# Patient Record
Sex: Female | Born: 2000 | Race: White | Hispanic: No | Marital: Single | Smoking: Current every day smoker
Health system: Southern US, Community
[De-identification: ages and names within clinical notes are randomized; demographics above are authoritative.]

## PROBLEM LIST (undated history)

## (undated) DIAGNOSIS — F319 Bipolar disorder, unspecified: Secondary | ICD-10-CM

## (undated) DIAGNOSIS — B009 Herpesviral infection, unspecified: Secondary | ICD-10-CM

## (undated) HISTORY — DX: Herpesviral infection, unspecified: B00.9

## (undated) HISTORY — PX: WISDOM TOOTH EXTRACTION: SHX21

## (undated) HISTORY — DX: Bipolar disorder, unspecified: F31.9

---
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
## 2019-01-28 ENCOUNTER — Ambulatory Visit: Payer: Medicaid Other | Admitting: Nurse Practitioner

## 2019-01-28 ENCOUNTER — Encounter: Payer: Self-pay | Admitting: Nurse Practitioner

## 2019-01-28 ENCOUNTER — Other Ambulatory Visit: Payer: Self-pay

## 2019-01-28 VITALS — BP 113/75 | HR 83 | Temp 98.2°F | Ht 63.0 in | Wt 130.5 lb

## 2019-01-28 DIAGNOSIS — F319 Bipolar disorder, unspecified: Secondary | ICD-10-CM

## 2019-01-28 DIAGNOSIS — J301 Allergic rhinitis due to pollen: Secondary | ICD-10-CM | POA: Diagnosis not present

## 2019-01-28 NOTE — Assessment & Plan Note (Signed)
Chronic, ongoing without SI/HI at this time.  Continue to collaborate with psychiatry at [facility].  Continue current medication regimen at this time, has psych visit in 2 weeks.

## 2019-01-28 NOTE — Progress Notes (Signed)
BP 113/75   Pulse 83   Temp 98.2 F (36.8 C) (Oral)   Ht 5\' 3"  (1.6 m)   Wt 130 lb 8 oz (59.2 kg)   SpO2 99%   BMI 23.12 kg/m    Subjective:    Patient ID: Erica Long, female    DOB: 07/17/2001, 18 y.o.   MRN: [id number]  HPI: Erica Long is a 18 y.o. female  Chief Complaint  Patient presents with  . Manic Behavior    8m f/u   BIPOLAR DISORDER: Currently followed by [facility] in [place], last saw one month ago.  At this time taking Trazodone and Seroquel.  Reports she accidentally missed an "appointment or two", but has rescheduled.  States the Trazodone is "not as effective anymore".  She has appointment with psychiatry in 2 weeks.   Mood status: stable Satisfied with current treatment?: with Seroquel Symptom severity: mild  Duration of current treatment : months Side effects: no Medication compliance: good compliance Psychotherapy/counseling: currently obtaining Previous psychiatric medications: Depakote Depressed mood: no Anxious mood: yes Anhedonia: no Significant weight loss or gain: no Insomnia: yes hard to fall asleep Fatigue: yes Feelings of worthlessness or guilt: no Impaired concentration/indecisiveness: no Suicidal ideations: no Hopelessness: no Crying spells: yes Depression screen [facility] 2/9 12/31/2018 11/28/2018 11/20/2018 09/24/2018 09/07/2018  Decreased Interest 1 1 3 3 3   Down, Depressed, Hopeless 1 1 3 3 3   PHQ - 2 Score 2 2 6 6 6   Altered sleeping 0 0 2 2 3   Tired, decreased energy 0 0 3 3 3   Change in appetite 0 2 0 3 1  Feeling bad or failure about yourself  0 0 3 3 3   Trouble concentrating 1 1 3 1 1   Moving slowly or fidgety/restless 0 0 3 1 1   Suicidal thoughts 0 0 1 2 -  PHQ-9 Score 3 5 21 21 18   Difficult doing work/chores Somewhat difficult Somewhat difficult Very difficult Somewhat difficult Somewhat difficult    ALLERGIES Reports history of frequent strep throat, she thinks about 4 times treated last year and  constant irritation "flaring" of tonsils.  Would like referral to ENT.  Currently takes Allegra for allergies. Duration: chronic Runny nose: yes "clear Nasal congestion: no Nasal itching: no Sneezing: yes Eye swelling, itching or discharge: no Post nasal drip: yes Cough: no Sinus pressure: no  Ear pain: none Ear pressure: none Fever: none Symptoms occur seasonally: yes Symptoms occur perenially: no Satisfied with current treatment: no Allergist evaluation in past: no Allergen injection immunotherapy: no Recurrent sinus infections: no ENT evaluation in past: no Known environmental allergy: yes Indoor pets: no History of asthma: no Current allergy medications: Allegra Treatments attempted: does not recall  Relevant past medical, surgical, family and social history reviewed and updated as indicated. Interim medical history since our last visit reviewed. Allergies and medications reviewed and updated.  Review of Systems  Constitutional: Negative for activity change, appetite change, diaphoresis, fatigue and fever.  Respiratory: Negative for cough, chest tightness and shortness of breath.   Cardiovascular: Negative for chest pain, palpitations and leg swelling.  Gastrointestinal: Negative for abdominal distention, abdominal pain, constipation, diarrhea, nausea and vomiting.  Endocrine: Negative for cold intolerance, heat intolerance, polydipsia, polyphagia and polyuria.  Neurological: Negative for dizziness, syncope, weakness, light-headedness, numbness and headaches.  Psychiatric/Behavioral: Negative for decreased concentration, self-injury, sleep disturbance and suicidal ideas.    Per HPI unless specifically indicated above     Objective:    BP 113/75  Pulse 83   Temp 98.2 F (36.8 C) (Oral)   Ht  (1.6 m)   Wt 130 lb 8 oz (59.2 kg)   SpO2 99%   BMI 23.12 kg/m   Wt Readings from Last 3 Encounters:  01/28/19 130 lb 8 oz (59.2 kg) (59 %, Z= 0.23)*  12/31/18 132  lb (59.9 kg) (62 %, Z= 0.31)*  11/28/18 131 lb (59.4 kg) (61 %, Z= 0.27)*   * Growth percentiles are based on CDC (Girls, 2-20 Years) data.    Physical Exam Vitals signs and nursing note reviewed.  Constitutional:      General: She is awake.     Appearance: She is well-developed.  HENT:     Head: Normocephalic.     Right Ear: Hearing, tympanic membrane, ear canal and external ear normal.     Left Ear: Hearing, tympanic membrane, ear canal and external ear normal.     Nose: Rhinorrhea present. No mucosal edema. Rhinorrhea is clear.     Right Sinus: No maxillary sinus tenderness or frontal sinus tenderness.     Left Sinus: No maxillary sinus tenderness or frontal sinus tenderness.     Mouth/Throat:     Mouth: Mucous membranes are moist.     Pharynx: Posterior oropharyngeal erythema (mild with cobblestoning) present. No pharyngeal swelling or oropharyngeal exudate.     Tonsils: Swelling: 1+ on the right. 1+ on the left.  Eyes:     General:        Right eye: No discharge.        Left eye: No discharge.     Conjunctiva/sclera: Conjunctivae normal.     Pupils: Pupils are equal, round, and reactive to light.  Neck:     Musculoskeletal: Normal range of motion and neck supple.     Thyroid: No thyromegaly.     Vascular: No carotid bruit or JVD.  Cardiovascular:     Rate and Rhythm: Normal rate and regular rhythm.     Heart sounds: Normal heart sounds. No murmur. No gallop.   Pulmonary:     Effort: Pulmonary effort is normal.     Breath sounds: Normal breath sounds.  Abdominal:     General: Bowel sounds are normal.     Palpations: Abdomen is soft.  Musculoskeletal:     Right lower leg: No edema.     Left lower leg: No edema.  Lymphadenopathy:     Cervical: No cervical adenopathy.  Skin:    General: Skin is warm and dry.  Neurological:     Mental Status: She is alert and oriented to person, place, and time.  Psychiatric:        Mood and Affect: Mood normal.        Behavior:  Behavior normal. Behavior is cooperative.        Thought Content: Thought content normal.        Judgment: Judgment normal.     Results for orders placed or performed in visit on 12/31/18  Pregnancy, urine  Result Value Ref Range   Preg Test, Ur Negative Negative      Assessment & Plan:   Problem List Items Addressed This Visit      Respiratory   Seasonal allergic rhinitis due to pollen    Reports h/o frequent strep and that she has frequent "throat flares" and irritation.  ENT referral per patient request.  Recommend switching to Zyrtec or Claritin for allergic rhinitis.      Relevant Orders  Ambulatory referral to ENT     Other   Bipolar I disorder (HCC) - Primary    Chronic, ongoing without SI/HI at this time.  Continue to collaborate with psychiatry at [facility]  Continue current medication regimen at this time, has psych visit in 2 weeks.          Follow up plan: Return in about 2 months (around 03/30/2019) for Depo Provera injection.

## 2019-01-28 NOTE — Assessment & Plan Note (Signed)
Reports h/o frequent strep and that she has frequent "throat flares" and irritation.  ENT referral per patient request.  Recommend switching to Zyrtec or Claritin for allergic rhinitis.

## 2019-01-28 NOTE — Patient Instructions (Signed)

## 2019-03-15 ENCOUNTER — Other Ambulatory Visit: Payer: Self-pay | Admitting: Nurse Practitioner

## 2019-03-25 ENCOUNTER — Telehealth: Payer: Medicaid Other

## 2019-03-25 ENCOUNTER — Other Ambulatory Visit: Payer: Self-pay

## 2019-03-25 DIAGNOSIS — Z3009 Encounter for other general counseling and advice on contraception: Secondary | ICD-10-CM

## 2019-03-25 MED ORDER — MEDROXYPROGESTERONE ACETATE 150 MG/ML IM SUSP
150.0000 mg | Freq: Once | INTRAMUSCULAR | Status: AC
  Administered 2019-03-25: 150 mg via INTRAMUSCULAR

## 2019-03-25 NOTE — Telephone Encounter (Signed)
She is seeing psychiatry and will need refill through them, as no notes available from them on current dose.

## 2019-03-25 NOTE — Telephone Encounter (Signed)
Patient stated that this was meant to be a fill for trazadone, not depakote. Explained to patient this may still have to come from psych because it's listed as historical med in chart, not that Erica Long has filled it.  Routing to provider to advise.

## 2019-03-25 NOTE — Progress Notes (Unsigned)
Pt was administered Depo injection IM in left deltoid. Pt tolerated well. Pt's next date range for Depo is July 27th - August 10.

## 2019-03-25 NOTE — Telephone Encounter (Signed)
Yes, since she is being followed by psych I prefer all psychiatric meds be filled by them as I do not have access to their notes at this time or any changes that have been made.  Thank you.

## 2019-03-25 NOTE — Telephone Encounter (Signed)
Patient notifeid and verbalized understanding

## 2019-03-25 NOTE — Telephone Encounter (Signed)
Medication refill for divalproex.  Last visit 01/28/2019

## 2019-06-10 ENCOUNTER — Ambulatory Visit (INDEPENDENT_AMBULATORY_CARE_PROVIDER_SITE_OTHER): Payer: Medicaid Other

## 2019-06-10 ENCOUNTER — Other Ambulatory Visit: Payer: Self-pay

## 2019-06-10 DIAGNOSIS — Z3042 Encounter for surveillance of injectable contraceptive: Secondary | ICD-10-CM

## 2019-06-10 MED ORDER — MEDROXYPROGESTERONE ACETATE 150 MG/ML IM SUSP
150.0000 mg | Freq: Once | INTRAMUSCULAR | Status: AC
  Administered 2019-06-10: 150 mg via INTRAMUSCULAR

## 2019-08-30 ENCOUNTER — Other Ambulatory Visit: Payer: Self-pay

## 2019-08-30 DIAGNOSIS — Z20828 Contact with and (suspected) exposure to other viral communicable diseases: Secondary | ICD-10-CM | POA: Diagnosis not present

## 2019-08-30 DIAGNOSIS — Z20822 Contact with and (suspected) exposure to covid-19: Secondary | ICD-10-CM

## 2019-09-01 LAB — NOVEL CORONAVIRUS, NAA: SARS-CoV-2, NAA: NOT DETECTED

## 2019-09-03 ENCOUNTER — Ambulatory Visit (INDEPENDENT_AMBULATORY_CARE_PROVIDER_SITE_OTHER): Payer: Medicaid Other

## 2019-09-03 ENCOUNTER — Other Ambulatory Visit: Payer: Self-pay

## 2019-09-03 DIAGNOSIS — Z3042 Encounter for surveillance of injectable contraceptive: Secondary | ICD-10-CM

## 2019-09-03 DIAGNOSIS — F3181 Bipolar II disorder: Secondary | ICD-10-CM | POA: Diagnosis not present

## 2019-09-03 MED ORDER — MEDROXYPROGESTERONE ACETATE 150 MG/ML IM SUSP
150.0000 mg | Freq: Once | INTRAMUSCULAR | Status: AC
  Administered 2019-09-03: 150 mg via INTRAMUSCULAR

## 2019-10-01 DIAGNOSIS — F3181 Bipolar II disorder: Secondary | ICD-10-CM | POA: Diagnosis not present

## 2019-10-29 DIAGNOSIS — F3181 Bipolar II disorder: Secondary | ICD-10-CM | POA: Diagnosis not present

## 2019-11-04 ENCOUNTER — Telehealth: Payer: Self-pay | Admitting: Nurse Practitioner

## 2019-11-04 DIAGNOSIS — J039 Acute tonsillitis, unspecified: Secondary | ICD-10-CM

## 2019-11-04 NOTE — Telephone Encounter (Signed)
Patient notified and verbalized understanding. 

## 2019-11-04 NOTE — Telephone Encounter (Signed)
Copied from [place] [id number]. Topic: Referral - Status >> Nov 04, 2019 10:58 AM Scherrie Gerlach wrote: Reason for CRM: pt needs new referral to [facility].  The last one was March 2020, and they were in lock down due to covid. They advised pt they need new referral for tonsil removal in order for them to see her.  Pt needs asap, having active issue and problems, need to be seen urgently.

## 2019-11-06 ENCOUNTER — Ambulatory Visit: Payer: Medicaid Other | Attending: Internal Medicine

## 2019-11-06 DIAGNOSIS — Z20828 Contact with and (suspected) exposure to other viral communicable diseases: Secondary | ICD-10-CM | POA: Diagnosis not present

## 2019-11-06 DIAGNOSIS — Z20822 Contact with and (suspected) exposure to covid-19: Secondary | ICD-10-CM

## 2019-11-08 ENCOUNTER — Emergency Department: Payer: Medicaid Other

## 2019-11-08 ENCOUNTER — Other Ambulatory Visit: Payer: Self-pay

## 2019-11-08 ENCOUNTER — Encounter: Payer: Self-pay | Admitting: Emergency Medicine

## 2019-11-08 ENCOUNTER — Emergency Department
Admission: EM | Admit: 2019-11-08 | Discharge: 2019-11-08 | Disposition: A | Discharge status: Home / Self Care | Payer: Medicaid Other | Attending: Emergency Medicine | Admitting: Emergency Medicine

## 2019-11-08 DIAGNOSIS — Z79899 Other long term (current) drug therapy: Secondary | ICD-10-CM | POA: Insufficient documentation

## 2019-11-08 DIAGNOSIS — L271 Localized skin eruption due to drugs and medicaments taken internally: Secondary | ICD-10-CM | POA: Insufficient documentation

## 2019-11-08 DIAGNOSIS — F17290 Nicotine dependence, other tobacco product, uncomplicated: Secondary | ICD-10-CM | POA: Insufficient documentation

## 2019-11-08 DIAGNOSIS — L270 Generalized skin eruption due to drugs and medicaments taken internally: Secondary | ICD-10-CM

## 2019-11-08 DIAGNOSIS — L509 Urticaria, unspecified: Secondary | ICD-10-CM | POA: Diagnosis not present

## 2019-11-08 LAB — GROUP A STREP BY PCR: Group A Strep by PCR: NOT DETECTED

## 2019-11-08 LAB — NOVEL CORONAVIRUS, NAA: SARS-CoV-2, NAA: NOT DETECTED

## 2019-11-08 MED ORDER — METHYLPREDNISOLONE 4 MG PO TBPK: ORAL_TABLET | ORAL | 0 refills | Status: DC

## 2019-11-08 MED ORDER — HYDROXYZINE HCL 50 MG PO TABS: 50.0000 mg | ORAL_TABLET | Freq: Three times a day (TID) | ORAL | 0 refills | Status: DC | PRN

## 2019-11-08 MED ORDER — PREDNISONE 20 MG PO TABS
60.0000 mg | ORAL_TABLET | Freq: Once | ORAL | Status: AC
  Administered 2019-11-08: 60 mg via ORAL
  Filled 2019-11-08: qty 3

## 2019-11-08 MED ORDER — HYDROXYZINE HCL 50 MG PO TABS
50.0000 mg | ORAL_TABLET | Freq: Once | ORAL | Status: AC
  Administered 2019-11-08: 50 mg via ORAL
  Filled 2019-11-08: qty 1

## 2019-11-08 NOTE — ED Notes (Addendum)
See triage note - pt with rash. Denies swollen tongue/airway.

## 2019-11-08 NOTE — ED Triage Notes (Signed)
States she woke up with hives/rash

## 2019-11-08 NOTE — ED Provider Notes (Signed)
[facility] Emergency Department Provider Note   ____________________________________________   First MD Initiated Contact with Patient 11/08/19 1522     (approximate)  I have reviewed the triage vital signs and the nursing notes.   HISTORY  Chief Complaint Urticaria    HPI Erica Long is a 18 y.o. female patient waking this morning with a rash.  Rash is diffuse.  Patient states mild "burning" sensation" similar to a sunburn to her body.  Patient denies any new foods or drinks.  Patient stated no new personal or laundry hygiene products.  Patient states he started taking Lamictal 2 weeks ago.  Patient denies pain.  Patient also complained of chest congestion and sore throat.  Patient tested negative for COVID-19 on 11/06/2019.     Past Medical History:  Diagnosis Date  . Bipolar 1 disorder [facility])     Patient Active Problem List   Diagnosis Date Noted  . Seasonal allergic rhinitis due to pollen 01/28/2019  . Birth control counseling 12/31/2018  . Bipolar I disorder ([place]) 09/07/2018    Past Surgical History:  Procedure Laterality Date  . WISDOM TOOTH EXTRACTION      Prior to Admission medications   Medication Sig Start Date End Date Taking? Authorizing Provider  divalproex (DEPAKOTE) 250 MG DR tablet TAKE 2 TABLETS (500 MG TOTAL) BY MOUTH AT BEDTIME. 02/17/19   [provider]  hydrOXYzine (ATARAX/VISTARIL) 50 MG tablet Take 1 tablet (50 mg total) by mouth 3 (three) times daily as needed. 11/08/19   Sable Feil, PA-C  medroxyPROGESTERone (DEPO-PROVERA) 150 MG/ML injection Inject into the muscle. 12/26/18   [provider]  methylPREDNISolone (MEDROL DOSEPAK) 4 MG TBPK tablet Take Tapered dose as directed 11/08/19   Sable Feil, PA-C  QUEtiapine (SEROQUEL) 25 MG tablet Take 1 tablet (25 mg total) by mouth at bedtime. 11/20/18   Cannady, Henrine Screws T, NP  traZODone (DESYREL) 50 MG tablet Take by mouth. 12/19/18   [provider]    Allergies Patient has no known allergies.  Family History  Problem Relation Age of Onset  . Bipolar disorder Mother   . Depression Mother   . Heart attack Maternal Grandmother   . COPD Paternal Grandmother   . Depression Paternal Grandmother     Social History Social History   Tobacco Use  . Smoking status: Current Every Day Smoker    Types: E-cigarettes  . Smokeless tobacco: Never Used  Substance Use Topics  . Alcohol use: Yes    Comment: occasional binge drinking  . Drug use: Yes    Types: Marijuana    Review of Systems Constitutional: No fever/chills Eyes: No visual changes. ENT: No sore throat. Cardiovascular: Denies chest pain. Respiratory: Dyspnea. Gastrointestinal: No abdominal pain.  No nausea, no vomiting.  No diarrhea.  No constipation. Genitourinary: Negative for dysuria. Musculoskeletal: Negative for back pain. Skin: Negative for rash. Neurological: Negative for headaches, focal weakness or numbness. Psychiatric:  Bipolar and depression  ____________________________________________   PHYSICAL EXAM:  VITAL SIGNS: ED Triage Vitals  Enc Vitals Group     BP 11/08/19 1315 121/81     Pulse Rate 11/08/19 1315 (!) 108     Resp 11/08/19 1315 18     Temp 11/08/19 1315 98.7 F (37.1 C)     Temp Source 11/08/19 1315 Oral     SpO2 11/08/19 1315 98 %     Weight 11/08/19 1315 112 lb (50.8 kg)     Height 11/08/19 1315  5\' 6"  (1.676 m)     Head Circumference --      Peak Flow --      Pain Score 11/08/19 1313 0     Pain Loc --      Pain Edu? --      Excl. in GC? --    Constitutional: Alert and oriented. Well appearing and in no acute distress. Eyes: Conjunctivae are normal. PERRL. EOMI. Head: Atraumatic. Nose: No congestion/rhinnorhea. Mouth/Throat: Mucous membranes are moist.  Oropharynx non-erythematous. Neck: No stridor.   Hematological/Lymphatic/Immunilogical: No cervical lymphadenopathy. Cardiovascular: Normal rate, regular  rhythm. Grossly normal heart sounds.  Good peripheral circulation. Respiratory: Normal respiratory effort.  No retractions. Lungs CTAB. Gastrointestinal: Soft and nontender. No distention. No abdominal bruits. No CVA tenderness. Neurologic:  Normal speech and language. No gross focal neurologic deficits are appreciated. No gait instability. Skin:  Skin is warm, dry and intact.  Diffuse erythematous macular lesions.   Psychiatric: Mood and affect are normal. Speech and behavior are normal.  ____________________________________________   LABS (all labs ordered are listed, but only abnormal results are displayed)  Labs Reviewed  GROUP A STREP BY PCR   ____________________________________________  EKG   ____________________________________________  RADIOLOGY  ED MD interpretation:    Official radiology report(s): DG Chest 2 View  Result Date: 11/08/2019 CLINICAL DATA:  Hives. EXAM: CHEST - 2 VIEW COMPARISON:  08/06/2016 FINDINGS: The heart size and mediastinal contours are within normal limits. Both lungs are clear. The visualized skeletal structures are unremarkable. IMPRESSION: Normal exam. Electronically Signed   By: 08/08/2016 M.D.   On: 11/08/2019 15:51    ____________________________________________   PROCEDURES  Procedure(s) performed (including Critical Care):  Procedures   ____________________________________________   INITIAL IMPRESSION / ASSESSMENT AND PLAN / ED COURSE  As part of my medical decision making, I reviewed the following data within the electronic MEDICAL RECORD NUMBER     Patient presents with diffuse body rash upon a.m. awakening.  Patient states burning sensation has decreased status post prednisone and Atarax.  Patient given discharge care instruction.  Patient advised to discontinue Lamictal until she contact the prescribing doctor since this medication is not prescribed for seizures..  Return back to ED if condition worsens    [name]  was evaluated in Emergency Department on 11/08/2019 for the symptoms described in the history of present illness. She was evaluated in the context of the global COVID-19 pandemic, which necessitated consideration that the patient might be at risk for infection with the SARS-CoV-2 virus that causes COVID-19. Institutional protocols and algorithms that pertain to the evaluation of patients at risk for COVID-19 are in a state of rapid change based on information released by regulatory bodies including the CDC and federal and state organizations. These policies and algorithms were followed during the patient's care in the ED.       ____________________________________________   FINAL CLINICAL IMPRESSION(S) / ED DIAGNOSES  Final diagnoses:  Drug rash     ED Discharge Orders         Ordered    hydrOXYzine (ATARAX/VISTARIL) 50 MG tablet  3 times daily PRN     11/08/19 1629    methylPREDNISolone (MEDROL DOSEPAK) 4 MG TBPK tablet     11/08/19 1629           Note:  This document was prepared using Dragon voice recognition software and may include unintentional dictation errors.    11/10/19, PA-C 11/08/19 1646    11/10/19, MD  11/19/19 0004  

## 2019-11-08 NOTE — Discharge Instructions (Addendum)
Discontinue Lamictal until follow-up with prescribing doctor.  Pick up medications in the morning and continue taking as directed.  Advised 25 mg of Benadryl until the pharmacy open.  Take 1 every 6-8 hours.

## 2019-11-25 ENCOUNTER — Other Ambulatory Visit: Payer: Self-pay | Admitting: Nurse Practitioner

## 2019-11-25 ENCOUNTER — Ambulatory Visit (INDEPENDENT_AMBULATORY_CARE_PROVIDER_SITE_OTHER): Payer: Medicaid Other

## 2019-11-25 ENCOUNTER — Other Ambulatory Visit: Payer: Self-pay

## 2019-11-25 DIAGNOSIS — Z3042 Encounter for surveillance of injectable contraceptive: Secondary | ICD-10-CM | POA: Diagnosis not present

## 2019-11-25 DIAGNOSIS — Z3009 Encounter for other general counseling and advice on contraception: Secondary | ICD-10-CM

## 2019-11-25 MED ORDER — MEDROXYPROGESTERONE ACETATE 150 MG/ML IM SUSP
150.0000 mg | Freq: Once | INTRAMUSCULAR | Status: AC
  Administered 2019-11-25: 150 mg via INTRAMUSCULAR

## 2019-11-26 DIAGNOSIS — F3181 Bipolar II disorder: Secondary | ICD-10-CM | POA: Diagnosis not present

## 2019-12-10 DIAGNOSIS — J309 Allergic rhinitis, unspecified: Secondary | ICD-10-CM | POA: Diagnosis not present

## 2019-12-10 DIAGNOSIS — K219 Gastro-esophageal reflux disease without esophagitis: Secondary | ICD-10-CM | POA: Diagnosis not present

## 2019-12-10 DIAGNOSIS — J312 Chronic pharyngitis: Secondary | ICD-10-CM | POA: Diagnosis not present

## 2019-12-17 DIAGNOSIS — J301 Allergic rhinitis due to pollen: Secondary | ICD-10-CM | POA: Diagnosis not present

## 2019-12-24 DIAGNOSIS — F3181 Bipolar II disorder: Secondary | ICD-10-CM | POA: Diagnosis not present

## 2020-01-01 DIAGNOSIS — K219 Gastro-esophageal reflux disease without esophagitis: Secondary | ICD-10-CM | POA: Diagnosis not present

## 2020-01-01 DIAGNOSIS — J309 Allergic rhinitis, unspecified: Secondary | ICD-10-CM | POA: Diagnosis not present
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
## 2020-02-10 ENCOUNTER — Other Ambulatory Visit: Payer: Self-pay

## 2020-02-10 ENCOUNTER — Ambulatory Visit (INDEPENDENT_AMBULATORY_CARE_PROVIDER_SITE_OTHER): Payer: Medicaid Other

## 2020-02-10 DIAGNOSIS — Z3042 Encounter for surveillance of injectable contraceptive: Secondary | ICD-10-CM

## 2020-02-10 MED ORDER — MEDROXYPROGESTERONE ACETATE 150 MG/ML IM SUSP
150.0000 mg | Freq: Once | INTRAMUSCULAR | Status: AC
  Administered 2020-02-10: 150 mg via INTRAMUSCULAR

## 2020-02-14 DIAGNOSIS — F3181 Bipolar II disorder: Secondary | ICD-10-CM | POA: Diagnosis not present

## 2020-02-29 ENCOUNTER — Ambulatory Visit: Payer: Medicaid Other | Attending: Internal Medicine

## 2020-02-29 DIAGNOSIS — Z23 Encounter for immunization: Secondary | ICD-10-CM

## 2020-02-29 NOTE — Progress Notes (Signed)
   Covid-19 Vaccination Clinic  Name:  QUINCI GAVIDIA    MRN: [id number] DOB: [birth year]  02/29/2020  Ms. Hord was observed post Covid-19 immunization for 15 minutes without incident. She was provided with Vaccine Information Sheet and instruction to access the V-Safe system.   Ms. Harnack was instructed to call 911 with any severe reactions post vaccine: Marland Kitchen Difficulty breathing  . Swelling of face and throat  . A fast heartbeat  . A bad rash all over body  . Dizziness and weakness   Immunizations Administered    Name Date Dose VIS Date Route   Pfizer COVID-19 Vaccine 02/29/2020  6:45 PM 0.3 mL 10/25/2019 Intramuscular   Manufacturer: [employer], [employer]   Lot: [id number]   NDC: [address]-0

## 2020-03-21 ENCOUNTER — Ambulatory Visit: Payer: Self-pay

## 2020-03-24 ENCOUNTER — Ambulatory Visit: Payer: Medicaid Other | Attending: Internal Medicine

## 2020-03-24 DIAGNOSIS — Z23 Encounter for immunization: Secondary | ICD-10-CM

## 2020-03-24 NOTE — Progress Notes (Signed)
   Covid-19 Vaccination Clinic  Name:  Erica Long    MRN: [id number] DOB: [birth year]  03/24/2020  [name] was observed post Covid-19 immunization for 15 minutes without incident. She was provided with Vaccine Information Sheet and instruction to access the V-Safe system.   [name] was instructed to call 911 with any severe reactions post vaccine: Marland Kitchen Difficulty breathing  . Swelling of face and throat  . A fast heartbeat  . A bad rash all over body  . Dizziness and weakness   Immunizations Administered    Name Date Dose VIS Date Route   Pfizer COVID-19 Vaccine 03/24/2020  4:12 PM 0.3 mL 01/08/2019 Intramuscular   Manufacturer: [employer], [employer]   Lot: [id number]   NDC: [address]-5

## 2020-04-08 ENCOUNTER — Ambulatory Visit: Payer: Medicaid Other | Admitting: Nurse Practitioner

## 2020-04-14 DIAGNOSIS — F3181 Bipolar II disorder: Secondary | ICD-10-CM | POA: Diagnosis not present

## 2020-05-04 DIAGNOSIS — Z20822 Contact with and (suspected) exposure to covid-19: Secondary | ICD-10-CM | POA: Diagnosis not present

## 2020-05-06 DIAGNOSIS — B349 Viral infection, unspecified: Secondary | ICD-10-CM | POA: Diagnosis not present

## 2020-05-06 DIAGNOSIS — R05 Cough: Secondary | ICD-10-CM | POA: Diagnosis not present

## 2020-05-07 ENCOUNTER — Ambulatory Visit: Payer: Medicaid Other

## 2020-06-16 DIAGNOSIS — F3181 Bipolar II disorder: Secondary | ICD-10-CM | POA: Diagnosis not present

## 2020-06-19 ENCOUNTER — Other Ambulatory Visit: Payer: Self-pay

## 2020-06-19 ENCOUNTER — Ambulatory Visit: Payer: Self-pay | Admitting: *Deleted

## 2020-06-19 ENCOUNTER — Encounter: Payer: Self-pay | Admitting: Emergency Medicine

## 2020-06-19 ENCOUNTER — Emergency Department
Admission: EM | Admit: 2020-06-19 | Discharge: 2020-06-19 | Disposition: A | Discharge status: Home / Self Care | Payer: Medicaid Other | Attending: Student in an Organized Health Care Education/Training Program | Admitting: Student in an Organized Health Care Education/Training Program

## 2020-06-19 ENCOUNTER — Emergency Department: Payer: Medicaid Other

## 2020-06-19 DIAGNOSIS — R55 Syncope and collapse: Secondary | ICD-10-CM | POA: Diagnosis not present

## 2020-06-19 DIAGNOSIS — F17290 Nicotine dependence, other tobacco product, uncomplicated: Secondary | ICD-10-CM | POA: Insufficient documentation

## 2020-06-19 DIAGNOSIS — R531 Weakness: Secondary | ICD-10-CM | POA: Diagnosis not present

## 2020-06-19 LAB — URINALYSIS, COMPLETE (UACMP) WITH MICROSCOPIC
Bilirubin Urine: NEGATIVE
Glucose, UA: NEGATIVE mg/dL
Hgb urine dipstick: NEGATIVE
Ketones, ur: NEGATIVE mg/dL
Leukocytes,Ua: NEGATIVE
Nitrite: NEGATIVE
Protein, ur: NEGATIVE mg/dL
Specific Gravity, Urine: 1.015 (ref 1.005–1.030)
pH: 6 (ref 5.0–8.0)

## 2020-06-19 LAB — BASIC METABOLIC PANEL
Anion gap: 9 (ref 5–15)
BUN: 11 mg/dL (ref 6–20)
CO2: 25 mmol/L (ref 22–32)
Calcium: 9.1 mg/dL (ref 8.9–10.3)
Chloride: 104 mmol/L (ref 98–111)
Creatinine, Ser: 0.66 mg/dL (ref 0.44–1.00)
GFR calc Af Amer: >60 mL/min (ref >60)
GFR calc non Af Amer: >60 mL/min (ref >60)
Glucose, Bld: 76 mg/dL (ref 70–99)
Potassium: 3.6 mmol/L (ref 3.5–5.1)
Sodium: 138 mmol/L (ref 135–145)

## 2020-06-19 LAB — CBC
HCT: 39 % (ref 36.0–46.0)
Hemoglobin: 14 g/dL (ref 12.0–15.0)
MCH: 30 pg (ref 26.0–34.0)
MCHC: 35.9 g/dL (ref 30.0–36.0)
MCV: 83.7 fL (ref 80.0–100.0)
Platelets: 225 10*3/uL (ref 150–400)
RBC: 4.66 MIL/uL (ref 3.87–5.11)
RDW: 12 % (ref 11.5–15.5)
WBC: 5.1 10*3/uL (ref 4.0–10.5)
nRBC: 0 % (ref 0.0–0.2)

## 2020-06-19 LAB — POCT PREGNANCY, URINE: Preg Test, Ur: NEGATIVE

## 2020-06-19 NOTE — Discharge Instructions (Addendum)
Your exam, labs, and CT scan are negative and reassuring at this time.  It is difficult to determine the exact cause of your episode of near fainting yesterday. You should follow-up with your providers for further evaluation. Take your prescribed medicines, and be sure to eat regularly to prevent any possible low blood sugar or dehydration. Return as needed.

## 2020-06-19 NOTE — Telephone Encounter (Signed)
Pt called in c/o feeling dizzy and weak after lunch (ate 2 bites of a sandwich in the car) 2 hours later.  I went into work and was told "you are shaking real bad".   I felt dizzy and weak also.   I went over to the gym and got a protein drink.   They told me at the gym they could see I was shaking.   I went back into work and my boss told me to go on home early. While driving home from work I was "zoning out" and staring at one spot.   My whole body felt very heavy.  I ended up pulling over for about 40 minutes because I couldn't drive.    Finally I was able to get home.   When I got home I went and laid down in the bed.   My left hip was numb and I was still "zoning out" and staring at one spot.  No history of strokes, no new medications started.    Then I took a shower and while in the shower I got real dizzy and weak especially on my left side.   I almost fell.   My left hip is hurting this morning and feels "weird".    Yesterday and this morning her lips feel "weird".   Denies any change in her taste or swelling in her mouth tongue or throat.   C/O her left side feeling numb or different.  I referred her to the ED per the protocol.   She is going to [facility].   I instructed her not to drive did she have someone who could take her.   She  Said she did.   "I don't think I could drive very well now anyway".  I told her to call 911 if she suddenly could not walk, became more dizzy, numb on either side of her body, speech became slurred, facial droop on one side, wasn't able to move one side of her body, visual changes.   She verbalized understanding and was agreeable.   She's going to call someone to take her to the ED today.   She asked if she needed to go today and I told her yes she needs to go as soon as possible/now.  She agreed.   I sent my notes to [facility] office at [facility] for her information.   Reason for Disposition . [1] Numbness (i.e.,  loss of sensation) of the face, arm / hand, or leg / foot on one side of the body AND [2] sudden onset AND [3] brief (now gone)  Answer Assessment - Initial Assessment Questions 1. SYMPTOM: "What is the main symptom you are concerned about?" (e.g., weakness, numbness)     Before work yesterday I sat in car and tried to eat lunch.   For 2 months I've not had much appetite.   I took 2 bites of sandwich.   2 hours later I felt dizzy and weak.   I went to the gym and got a protein drink.   I was still shaking.   My boss told me to go home early.   I was "zoning out".   I was just staring at one thing.    I was driving home and pulled over for 40 min.   I finally drove home and rested in bed. My left hip went numb.  Then I took a shower and got dizzy and got weak  and almost fell due to the left side weak. While driving I felt so heavy all over my body.  I usually drive with one hand on wheel but I drove with 2 hands on the wheel driving home.   I just kept zoning out and staring at one spot.   2. ONSET: "When did this start?" (minutes, hours, days; while sleeping)     Yesterday about 5:00 PM. 3. LAST NORMAL: "When was the last time you were normal (no symptoms)?"     5:00 PM 4. PATTERN "Does this come and go, or has it been constant since it started?"  "Is it present now?"     My left hip hurts.  When I woke up I forced myself to eat breakfast.   In the gym I'm trying to gain weight.   I lost 10 lbs then 10 more lbs.  I lost the first 10 lbs over a month and the 2nd 10 lbs quickly.   I was going to the gym at this point.    I'm trying to gain weight. 5. CARDIAC SYMPTOMS: "Have you had any of the following symptoms: chest pain, difficulty breathing, palpitations?"     No.    6. NEUROLOGIC SYMPTOMS: "Have you had any of the following symptoms: headache, dizziness, vision loss, double vision, changes in speech, unsteady on your feet?"     *No Answer* 7. OTHER SYMPTOMS: "Do you have any other symptoms?"      Lips feel weird when I was drinking the protein drink yesterday.  Today my lips still feel "weird".   My throat is normal.  I see an ENT for throat problems due to allergies.   Nothing new for me. 8. PREGNANCY: "Is there any chance you are pregnant?" "When was your last menstrual period?"     No I take birth control and not sexually active.  Protocols used: NEUROLOGIC DEFICIT-A-AH

## 2020-06-19 NOTE — Telephone Encounter (Signed)
Agree with ER referral and will follow-up with her after ER visit

## 2020-06-19 NOTE — ED Triage Notes (Signed)
Pt here for near syncope while at work yesterday.  Also c/o left knee and hip pain.  ambulatory without difficulty. VSS. No fevers.

## 2020-06-19 NOTE — ED Provider Notes (Signed)
[facility] Emergency Department Provider Note ____________________________________________  Time seen: 1145  I have reviewed the triage vital signs and the nursing notes.  HISTORY  Chief Complaint  Near Syncope  HPI Erica Long is a 19 y.o. female with a history of bipolar disorder, presents herself to the ED for evaluation of a near syncopal episode that occurred while at work yesterday. sSe describes an episode after awakening and reporting to work, when she began to feel weak and dizzy. She notified her supervisor, who advised her to come into the office to sit down. She reports that she did not pass out or lose consciousness. According to report given to the patient, her supervisor witnessed her "zoning out" and staring off into space, for about 30-40 minutes. The patient skipped breakfast that morning as she works a swing schedule. She denies nausea, vomiting, fevers, chills, sweats, chest pain, or paralysis. She notes that her left arm and leg felt heavy as she drove herself home from work. No one called EMS to evaluate the patient at the time of her near-syncopal episode. Once home, she ate some gummy bears after taking a shower. She reported to work today, but was advised by her supervisor to report to the ED for evaluation. She called her PCP who was concerned for a possible stroke, given her left-sided weakness. She presents today without headache, vision changes, paralysis, weakness, or paresthesias.   Past Medical History:  Diagnosis Date  . Bipolar 1 disorder [facility])     Patient Active Problem List   Diagnosis Date Noted  . Seasonal allergic rhinitis due to pollen 01/28/2019  . Birth control counseling 12/31/2018  . Bipolar I disorder (HCC) 09/07/2018    Past Surgical History:  Procedure Laterality Date  . WISDOM TOOTH EXTRACTION      Prior to Admission medications   Medication Sig Start Date End Date Taking? Authorizing Provider  cetirizine  (ZYRTEC) 10 MG tablet Take 10 mg by mouth daily.   Yes [provider]  fluticasone (FLONASE) 50 MCG/ACT nasal spray Place 1 spray into both nostrils daily.   Yes [provider]  Oxcarbazepine (TRILEPTAL) 300 MG tablet Take 300 mg by mouth 2 (two) times daily.   Yes [provider]  QUEtiapine (SEROQUEL) 100 MG tablet Take 100 mg by mouth at bedtime.   Yes [provider]  divalproex (DEPAKOTE) 250 MG DR tablet TAKE 2 TABLETS (500 MG TOTAL) BY MOUTH AT BEDTIME. 02/17/19   [provider]  hydrOXYzine (ATARAX/VISTARIL) 50 MG tablet Take 1 tablet (50 mg total) by mouth 3 (three) times daily as needed. 11/08/19   Joni Reining, PA-C  medroxyPROGESTERone (DEPO-PROVERA) 150 MG/ML injection Inject into the muscle. 12/26/18   [provider]  methylPREDNISolone (MEDROL DOSEPAK) 4 MG TBPK tablet Take Tapered dose as directed 11/08/19   Joni Reining, PA-C  QUEtiapine (SEROQUEL) 25 MG tablet Take 1 tablet (25 mg total) by mouth at bedtime. 11/20/18   Cannady, Corrie Dandy T, NP  traZODone (DESYREL) 50 MG tablet Take by mouth. 12/19/18   [provider]    Allergies Patient has no known allergies.  Family History  Problem Relation Age of Onset  . Bipolar disorder Mother   . Depression Mother   . Heart attack Maternal Grandmother   . COPD Paternal Grandmother   . Depression Paternal Grandmother     Social History Social History   Tobacco Use  . Smoking status: Current Every Day Smoker  Types: E-cigarettes  . Smokeless tobacco: Never Used  Vaping Use  . Vaping Use: Every day  . Substances: Nicotine  Substance Use Topics  . Alcohol use: Yes    Comment: occasional binge drinking  . Drug use: Yes    Types: Marijuana    Review of Systems  Constitutional: Negative for fever. Eyes: Negative for visual changes. ENT: Negative for sore throat. Cardiovascular: Negative for chest pain.  Reports near syncope as above. Respiratory:  Negative for shortness of breath. Gastrointestinal: Negative for abdominal pain, vomiting and diarrhea. Genitourinary: Negative for dysuria. Musculoskeletal: Negative for back pain.  Reports left hip and knee pain. Skin: Negative for rash. Neurological: Negative for headaches, focal weakness or numbness. ____________________________________________  PHYSICAL EXAM:  VITAL SIGNS: ED Triage Vitals  Enc Vitals Group     BP 06/19/20 1115 122/64     Pulse Rate 06/19/20 1115 74     Resp 06/19/20 1115 16     Temp 06/19/20 1115 98.2 F (36.8 C)     Temp Source 06/19/20 1115 Oral     SpO2 06/19/20 1115 99 %     Weight 06/19/20 1116 108 lb (49 kg)     Height 06/19/20 1116 5\' 5"  (1.651 m)     Head Circumference --      Peak Flow --      Pain Score 06/19/20 1116 5     Pain Loc --      Pain Edu? --      Excl. in GC? --     Constitutional: Alert and oriented. Well appearing and in no distress. GCS = 15 Head: Normocephalic and atraumatic. No facial droop or asymmetry.  Eyes: Conjunctivae are normal. PERRL. Normal extraocular movements and fundi bilaterally Ears: Canals clear. TMs intact bilaterally. Nose: No congestion/rhinorrhea/epistaxis. Mouth/Throat: Mucous membranes are moist. Neck: Supple. Normal ROM, no nuchal rigidity Cardiovascular: Normal rate, regular rhythm. Normal distal pulses. Respiratory: Normal respiratory effort. No wheezes/rales/rhonchi. Gastrointestinal: Soft and nontender. No distention. Musculoskeletal: Nontender with normal range of motion in all extremities.  Neurologic: Cranial nerves II through XII grossly intact.  No cerebellar ataxia appreciated.  Normal finger-to-nose exam.  Negative pronator drift.  Normal tandem walk.  Normal gait without ataxia. Normal speech and language. No gross focal neurologic deficits are appreciated. Skin:  Skin is warm, dry and intact. No rash noted. Psychiatric: Mood and affect are normal. Patient exhibits appropriate insight and  judgment. ____________________________________________   LABS (pertinent positives/negatives)  Labs Reviewed  URINALYSIS, COMPLETE (UACMP) WITH MICROSCOPIC - Abnormal; Notable for the following components:      Result Value   Color, Urine YELLOW (*)    APPearance HAZY (*)    Bacteria, UA RARE (*)    All other components within normal limits  BASIC METABOLIC PANEL  CBC  POC URINE PREG, ED  POCT PREGNANCY, URINE  ____________________________________________  EKG NSR 79 bpm PR 102 ms QRS 80 ms Normal axis No STEMI ____________________________________________   RADIOLOGY  Head CT w/o CM IMPRESSION: No acute intracranial abnormality. ____________________________________________  PROCEDURES  Procedures ____________________________________________  INITIAL IMPRESSION / ASSESSMENT AND PLAN / ED COURSE  Differential diagnosis includes, but is not limited to, hypoglycemia, orthostatic hypotension, dehydration, absence seizures, previous head trauma, viral illness, pregnancy  Patient with ED evaluation of a near-syncopal episode yesterday. Her exam, labs, EKG, and CT scan are normal and reassuring at this time. She is reassured by her normal workup. She is referred to her PCP and therapist for continued symptoms.  Erica Long was evaluated in Emergency Department on 06/20/2020 for the symptoms described in the history of present illness. She was evaluated in the context of the global COVID-19 pandemic, which necessitated consideration that the patient might be at risk for infection with the SARS-CoV-2 virus that causes COVID-19. Institutional protocols and algorithms that pertain to the evaluation of patients at risk for COVID-19 are in a state of rapid change based on information released by regulatory bodies including the CDC and federal and state organizations. These policies and algorithms were followed during the patient's care in the  ED. ____________________________________________  FINAL CLINICAL IMPRESSION(S) / ED DIAGNOSES  Final diagnoses:  Near syncope      Karmen Stabs, Charlesetta Ivory, PA-C 06/20/20 1901    Willy Eddy, MD 06/22/20 [phone]

## 2020-06-19 NOTE — ED Notes (Signed)
See triage note  States she had near syncopal episode yesterday while at work  Provider in with pt on arrival

## 2020-06-21 ENCOUNTER — Other Ambulatory Visit: Payer: Self-pay

## 2020-06-21 ENCOUNTER — Encounter: Payer: Self-pay | Admitting: Emergency Medicine

## 2020-06-21 ENCOUNTER — Emergency Department
Admission: EM | Admit: 2020-06-21 | Discharge: 2020-06-22 | Disposition: A | Discharge status: Home / Self Care | Payer: Medicaid Other | Attending: Emergency Medicine | Admitting: Emergency Medicine

## 2020-06-21 DIAGNOSIS — R569 Unspecified convulsions: Secondary | ICD-10-CM | POA: Insufficient documentation

## 2020-06-21 DIAGNOSIS — R531 Weakness: Secondary | ICD-10-CM | POA: Diagnosis not present

## 2020-06-21 DIAGNOSIS — Z5321 Procedure and treatment not carried out due to patient leaving prior to being seen by health care provider: Secondary | ICD-10-CM | POA: Insufficient documentation

## 2020-06-21 DIAGNOSIS — R000 Tachycardia, unspecified: Secondary | ICD-10-CM | POA: Diagnosis not present

## 2020-06-21 LAB — BASIC METABOLIC PANEL
Anion gap: 10 (ref 5–15)
BUN: 13 mg/dL (ref 6–20)
CO2: 24 mmol/L (ref 22–32)
Calcium: 9.3 mg/dL (ref 8.9–10.3)
Chloride: 104 mmol/L (ref 98–111)
Creatinine, Ser: 0.67 mg/dL (ref 0.44–1.00)
GFR calc Af Amer: >60 mL/min (ref >60)
GFR calc non Af Amer: >60 mL/min (ref >60)
Glucose, Bld: 88 mg/dL (ref 70–99)
Potassium: 4.1 mmol/L (ref 3.5–5.1)
Sodium: 138 mmol/L (ref 135–145)

## 2020-06-21 LAB — CBC WITH DIFFERENTIAL/PLATELET
Abs Immature Granulocytes: 0.01 10*3/uL (ref 0.00–0.07)
Basophils Absolute: 0.1 10*3/uL (ref 0.0–0.1)
Basophils Relative: 1 %
Eosinophils Absolute: 0 10*3/uL (ref 0.0–0.5)
Eosinophils Relative: 1 %
HCT: 39 % (ref 36.0–46.0)
Hemoglobin: 14 g/dL (ref 12.0–15.0)
Immature Granulocytes: 0 %
Lymphocytes Relative: 25 %
Lymphs Abs: 1.3 10*3/uL (ref 0.7–4.0)
MCH: 30 pg (ref 26.0–34.0)
MCHC: 35.9 g/dL (ref 30.0–36.0)
MCV: 83.7 fL (ref 80.0–100.0)
Monocytes Absolute: 0.5 10*3/uL (ref 0.1–1.0)
Monocytes Relative: 8 %
Neutro Abs: 3.5 10*3/uL (ref 1.7–7.7)
Neutrophils Relative %: 65 %
Platelets: 235 10*3/uL (ref 150–400)
RBC: 4.66 MIL/uL (ref 3.87–5.11)
RDW: 11.9 % (ref 11.5–15.5)
WBC: 5.3 10*3/uL (ref 4.0–10.5)
nRBC: 0 % (ref 0.0–0.2)

## 2020-06-21 LAB — URINE DRUG SCREEN, QUALITATIVE (ARMC ONLY)
Amphetamines, Ur Screen: NOT DETECTED
Barbiturates, Ur Screen: NOT DETECTED
Benzodiazepine, Ur Scrn: NOT DETECTED
Cannabinoid 50 Ng, Ur ~~LOC~~: POSITIVE — AB
Cocaine Metabolite,Ur ~~LOC~~: NOT DETECTED
MDMA (Ecstasy)Ur Screen: NOT DETECTED
Methadone Scn, Ur: NOT DETECTED
Opiate, Ur Screen: NOT DETECTED
Phencyclidine (PCP) Ur S: NOT DETECTED
Tricyclic, Ur Screen: POSITIVE — AB

## 2020-06-21 LAB — URINALYSIS, COMPLETE (UACMP) WITH MICROSCOPIC
Bilirubin Urine: NEGATIVE
Glucose, UA: NEGATIVE mg/dL
Hgb urine dipstick: NEGATIVE
Ketones, ur: 20 mg/dL — AB
Leukocytes,Ua: NEGATIVE
Nitrite: NEGATIVE
Protein, ur: NEGATIVE mg/dL
Specific Gravity, Urine: 1.027 (ref 1.005–1.030)
pH: 5 (ref 5.0–8.0)

## 2020-06-21 LAB — TROPONIN I (HIGH SENSITIVITY): Troponin I (High Sensitivity): <2 ng/L (ref <18)

## 2020-06-21 LAB — PREGNANCY, URINE: Preg Test, Ur: NEGATIVE

## 2020-06-21 LAB — POCT PREGNANCY, URINE: Preg Test, Ur: NEGATIVE
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
## 2020-06-21 NOTE — ED Provider Notes (Signed)
MSE was initiated and I personally evaluated the patient and placed orders (if any) at  5:21 PM on June 21, 2020.  The patient appears stable so that the remainder of the MSE may be completed by another provider.   Chinita Pester, FNP 06/21/20 1721    Sharyn Creamer, MD 06/22/20 [phone]

## 2020-06-22 NOTE — ED Notes (Signed)
No answer when called, pt not outside immediate entrance doors.

## 2020-06-22 NOTE — ED Notes (Signed)
Pt called x2 for repeat vital sign check, no answer

## 2020-06-23 ENCOUNTER — Encounter: Payer: Self-pay | Admitting: Nurse Practitioner

## 2020-06-23 ENCOUNTER — Telehealth: Payer: Self-pay | Admitting: *Deleted

## 2020-06-23 ENCOUNTER — Other Ambulatory Visit: Payer: Self-pay

## 2020-06-23 ENCOUNTER — Ambulatory Visit (INDEPENDENT_AMBULATORY_CARE_PROVIDER_SITE_OTHER): Payer: Medicaid Other | Admitting: Nurse Practitioner

## 2020-06-23 VITALS — BP 112/74 | HR 81 | Temp 98.5°F | Wt 115.8 lb

## 2020-06-23 DIAGNOSIS — F319 Bipolar disorder, unspecified: Secondary | ICD-10-CM

## 2020-06-23 DIAGNOSIS — F3181 Bipolar II disorder: Secondary | ICD-10-CM | POA: Diagnosis not present

## 2020-06-23 DIAGNOSIS — R55 Syncope and collapse: Secondary | ICD-10-CM

## 2020-06-23 NOTE — Patient Instructions (Signed)
     Syncope Syncope is when you pass out (faint) for a short time. It is caused by a sudden decrease in blood flow to the brain. Signs that you may be about to pass out include:  Feeling dizzy or light-headed.  Feeling sick to your stomach (nauseous).  Seeing all white or all black.  Having cold, clammy skin. If you pass out, get help right away. Call your local emergency services (911 in the [place] Do not drive yourself to the hospital. Follow these instructions at home: Watch for any changes in your symptoms. Take these actions to stay safe and help with your symptoms: Lifestyle  Do not drive, use machinery, or play sports until your doctor says it is okay.  Do not drink alcohol.  Do not use any products that contain nicotine or tobacco, such as cigarettes and e-cigarettes. If you need help quitting, ask your doctor.  Drink enough fluid to keep your pee (urine) pale yellow. General instructions  Take over-the-counter and prescription medicines only as told by your doctor.  If you are taking blood pressure or heart medicine, sit up and stand up slowly. Spend a few minutes getting ready to sit and then stand. This can help you feel less dizzy.  Have someone stay with you until you feel stable.  If you start to feel like you might pass out, lie down right away and raise (elevate) your feet above the level of your heart. Breathe deeply and steadily. Wait until all of the symptoms are gone.  Keep all follow-up visits as told by your doctor. This is important. Get help right away if:  You have a very bad headache.  You pass out once or more than once.  You have pain in your chest, belly, or back.  You have a very fast or uneven heartbeat (palpitations).  It hurts to breathe.  You are bleeding from your mouth or your bottom (rectum).  You have black or tarry poop (stool).  You have jerky movements that you cannot control (seizure).  You are confused.  You have  trouble walking.  You are very weak.  You have vision problems. These symptoms may be an emergency. Do not wait to see if the symptoms will go away. Get medical help right away. Call your local emergency services (911 in the [place] Do not drive yourself to the hospital. Summary  Syncope is when you pass out (faint) for a short time. It is caused by a sudden decrease in blood flow to the brain.  Signs that you may be about to faint include feeling dizzy, light-headed, or sick to your stomach, seeing all white or all black, or having cold, clammy skin.  If you start to feel like you might pass out, lie down right away and raise (elevate) your feet above the level of your heart. Breathe deeply and steadily. Wait until all of the symptoms are gone. This information is not intended to replace advice given to you by your health care provider. Make sure you discuss any questions you have with your health care provider. Document Revised: 12/13/2017 Document Reviewed: 12/13/2017 Elsevier Patient Education  2020 Elsevier Inc.  

## 2020-06-23 NOTE — Assessment & Plan Note (Signed)
New episodes, seen in ER with normal CT head and unremarkable blood work.  Psychiatrist concerned for seizures.  ? Seizure disorder vs possible tardive dyskinesia -- she is on Trileptal for Bipolar and Seroquel.  No red flags on exam today.  Urgent referral to neurology placed.  Labs today to include CBC, BMP, TSH, RPR, HIV.  Return in 6 weeks for follow-up, sooner for worsening symptoms.  Immediately go to ER if active seizure presents.

## 2020-06-23 NOTE — Telephone Encounter (Signed)
Erica Long presented to the ED and left before being seen by the provider on 06/21/20 The patient has been enrolled in an automated general discharge outreach program and 2 attempts to contact the patient will be made to follow up on their ED visit and subsequent needs. The care management team is available to provide assistance to this patient at any time.   Burnard Bunting, RN, BSN, CCRN Patient Engagement Center [phone]

## 2020-06-23 NOTE — Assessment & Plan Note (Signed)
Chronic, ongoing without SI/HI at this time.  Continue to collaborate with psychiatry at [facility].  Continue current medication regimen at this time as prescribed by them, recent visit this morning with them during which she reports they recommended she see neuro.  Obtain labs today.  Return in 6 weeks.

## 2020-06-23 NOTE — Telephone Encounter (Signed)
Contacted pt to complete transition of care assessment:  Transition Care Management Follow-up Telephone Call  . Medicaid Managed Care Transition Call Status:MM [facility] Call Made  . Date of discharge and from where: [facility], 06/19/20  . How have you been since you were released from the hospital? "not better, have doctor's appt today at 1420"  . Any questions or concerns? "Told to follow up with psychiatrist and doctor"  Items Reviewed: Marland Kitchen Did the pt receive and understand the discharge instructions provided? No  . Medications obtained and verified? n/a . Any new allergies since your discharge? No  . Dietary orders reviewed? No . Do you have support at home?  Yes, family  Functional Questionnaire: (I = Independent and D = Dependent)  ADLs: Independent Bathing/Dressing:Independent Meal Prep: Independent Eating: Independent Maintaining continence: Independent Transferring/Ambulation: Independent Managing Meds: Independent   Follow up appointments reviewed:  PCP Hospital f/u appt confirmed? Yes  Scheduled to see Aura Dials on 06/23/20 @120 . Specialist Hospital f/u appt confirmed?  Pt states she was seen by at [facility] Are transportation arrangements needed? No   If their condition worsens, is the pt aware to call PCP or go to the EmergencyDept.? Yes  Was the patient provided with contact information for the PCP's office or ED?  yes  Was to pt encouraged to call back with questions or concerns? yes [facility], RN, BSN, CCRN Patient Engagement Center [phone]

## 2020-06-23 NOTE — Progress Notes (Signed)
BP 112/74 (BP Location: Left Arm, Patient Position: Sitting, Cuff Size: Small)   Pulse 81   Temp 98.5 F (36.9 C) (Oral)   Wt 115 lb 12.8 oz (52.5 kg)   SpO2 98%   BMI 19.27 kg/m    Subjective:    Patient ID: Erica Long, female    DOB: 06/28/2001, 19 y.o.   MRN: [id number]  HPI: Erica Long is a 19 y.o. female  Chief Complaint  Patient presents with  . Loss of Consciousness   ANXIETY/STRESS Was [facility] [place], last saw this morning. Is taking Trileptal 300 MG BID and Seroquel 100 MG QHS for Bipolar Disorder.  Today they cut her Seroquel in 1/2, "to be on safe side".  Reports he psychiatrist was concerned about seizure disorder, she recommended neurology referral.  She reports mood has been stable on this regimen for 9 months.  Went to ER 06/19/20 for syncope and 06/21/20 she showed at ER, but appears not to have been seen as appears to have left prior to being seen.  Had UDS noting tricyclic and cannabinoid positive, blood work unremarkable.  CT head performed with no acute findings.   Duration:stable Anxious mood: no  Excessive worrying: no Irritability: no  Sweating: no Nausea: no Palpitations:no Hyperventilation: no Panic attacks: no Agoraphobia: no  Obscessions/compulsions: no Depressed mood: no [facility] 06/23/2020 12/31/2018 11/28/2018 11/20/2018 09/24/2018  Decreased Interest 3 1 1 3 3   Down, Depressed, Hopeless 1 1 1 3 3   PHQ - 2 Score 4 2 2 6 6   Altered sleeping 2 0 0 2 2  Tired, decreased energy 3 0 0 3 3  Change in appetite 3 0 2 0 3  Feeling bad or failure about yourself  0 0 0 3 3  Trouble concentrating 1 1 1 3 1   Moving slowly or fidgety/restless 2 0 0 3 1  Suicidal thoughts 0 0 0 1 2  PHQ-9 Score 15 3 5 21 21   Difficult doing work/chores Somewhat difficult Somewhat difficult Somewhat difficult Very difficult Somewhat difficult   Anhedonia: no Weight changes: no Insomnia: none Hypersomnia:  no Fatigue/loss of energy: no Feelings of worthlessness: no Feelings of guilt: no Impaired concentration/indecisiveness: no Suicidal ideations: no  Crying spells: no Recent Stressors/Life Changes: no   Relationship problems: no   Family stress: no     Financial stress: no    Job stress: no    Recent death/loss: no  SYNCOPE: Was seen in ER 06/19/2020 for near syncope episode.  Patient reports she fainted three times prior to EMT arriving at house, reports on Friday had episodes lasting 3 hours -- extreme tremors, rocking back and forth, zoning out, and nausea with emesis.  Fainted x 3 on Friday.  On Saturday had intense twitching and muscle spasms, Sunday had similar episode with muscle spasms and eyes rolling back.  Since Sunday has not lost consciousness again.  On Sunday when she noticed episode starting she felt herself staring off and then episode started.  Denies SOB, CP, HA, loss of function, slurred speech, or vision changes.  Relevant past medical, surgical, family and social history reviewed and updated as indicated. Interim medical history since our last visit reviewed. Allergies and medications reviewed and updated.  Review of Systems  Constitutional: Negative for activity change, appetite change, diaphoresis, fatigue and fever.  Respiratory: Negative for cough, chest tightness and shortness of breath.   Cardiovascular: Negative for chest pain, palpitations and leg  swelling.  Gastrointestinal: Negative.   Endocrine: Negative for cold intolerance, heat intolerance, polydipsia, polyphagia and polyuria.  Neurological: Positive for tremors and syncope. Negative for dizziness, facial asymmetry, speech difficulty, weakness, light-headedness, numbness and headaches.  Psychiatric/Behavioral: Negative for decreased concentration, self-injury, sleep disturbance and suicidal ideas. The patient is nervous/anxious.     Per HPI unless specifically indicated above     Objective:    BP  112/74 (BP Location: Left Arm, Patient Position: Sitting, Cuff Size: Small)   Pulse 81   Temp 98.5 F (36.9 C) (Oral)   Wt 115 lb 12.8 oz (52.5 kg)   SpO2 98%   BMI 19.27 kg/m   Wt Readings from Last 3 Encounters:  06/23/20 115 lb 12.8 oz (52.5 kg)  06/21/20 108 lb (49 kg)  06/19/20 108 lb (49 kg)    Physical Exam Vitals and nursing note reviewed.  Constitutional:      General: She is awake. She is not in acute distress.    Appearance: She is well-developed and well-groomed. She is not ill-appearing.  HENT:     Head: Normocephalic.     Right Ear: Hearing normal.     Left Ear: Hearing normal.  Eyes:     General: Lids are normal.        Right eye: No discharge.        Left eye: No discharge.     Conjunctiva/sclera: Conjunctivae normal.     Pupils: Pupils are equal, round, and reactive to light.  Neck:     Thyroid: No thyromegaly.     Vascular: No carotid bruit.  Cardiovascular:     Rate and Rhythm: Normal rate and regular rhythm.     Heart sounds: Normal heart sounds. No murmur heard.  No gallop.   Pulmonary:     Effort: Pulmonary effort is normal. No accessory muscle usage or respiratory distress.     Breath sounds: Normal breath sounds.  Abdominal:     General: Bowel sounds are normal.     Palpations: Abdomen is soft.  Musculoskeletal:     Cervical back: Normal range of motion and neck supple.     Right lower leg: No edema.     Left lower leg: No edema.  Skin:    General: Skin is warm and dry.  Neurological:     Mental Status: She is alert and oriented to person, place, and time.     Cranial Nerves: Cranial nerves are intact.     Motor: Motor function is intact.     Coordination: Coordination is intact.     Gait: Gait is intact.     Deep Tendon Reflexes: Reflexes are normal and symmetric.     Reflex Scores:      Brachioradialis reflexes are 2+ on the right side and 2+ on the left side.      Patellar reflexes are 2+ on the right side and 2+ on the left side.     Comments: Intermittent mild twitch noted to left upper extremity.  Psychiatric:        Attention and Perception: Attention normal.        Mood and Affect: Mood normal.        Speech: Speech normal.        Behavior: Behavior normal. Behavior is cooperative.        Thought Content: Thought content normal.     Results for orders placed or performed during the hospital encounter of 06/21/20  Basic metabolic panel  Result Value  Ref Range   Sodium 138 135 - 145 mmol/L   Potassium 4.1 3.5 - 5.1 mmol/L   Chloride 104 98 - 111 mmol/L   CO2 24 22 - 32 mmol/L   Glucose, Bld 88 70 - 99 mg/dL   BUN 13 6 - 20 mg/dL   Creatinine, Ser 8.54 0.44 - 1.00 mg/dL   Calcium 9.3 8.9 - 62.7 mg/dL   GFR calc non Af Amer >60 >60 mL/min   GFR calc Af Amer >60 >60 mL/min   Anion gap 10 5 - 15  CBC with Differential  Result Value Ref Range   WBC 5.3 4.0 - 10.5 K/uL   RBC 4.66 3.87 - 5.11 MIL/uL   Hemoglobin 14.0 12.0 - 15.0 g/dL   HCT 03.5 36 - 46 %   MCV 83.7 80.0 - 100.0 fL   MCH 30.0 26.0 - 34.0 pg   MCHC 35.9 30.0 - 36.0 g/dL   RDW 00.9 38.1 - 82.9 %   Platelets 235 150 - 400 K/uL   nRBC 0.0 0.0 - 0.2 %   Neutrophils Relative % 65 %   Neutro Abs 3.5 1.7 - 7.7 K/uL   Lymphocytes Relative 25 %   Lymphs Abs 1.3 0.7 - 4.0 K/uL   Monocytes Relative 8 %   Monocytes Absolute 0.5 0 - 1 K/uL   Eosinophils Relative 1 %   Eosinophils Absolute 0.0 0 - 0 K/uL   Basophils Relative 1 %   Basophils Absolute 0.1 0 - 0 K/uL   Immature Granulocytes 0 %   Abs Immature Granulocytes 0.01 0.00 - 0.07 K/uL  Urinalysis, Complete w Microscopic  Result Value Ref Range   Color, Urine YELLOW (A) YELLOW   APPearance HAZY (A) CLEAR   Specific Gravity, Urine 1.027 1.005 - 1.030   pH 5.0 5.0 - 8.0   Glucose, UA NEGATIVE NEGATIVE mg/dL   Hgb urine dipstick NEGATIVE NEGATIVE   Bilirubin Urine NEGATIVE NEGATIVE   Ketones, ur 20 (A) NEGATIVE mg/dL   Protein, ur NEGATIVE NEGATIVE mg/dL   Nitrite NEGATIVE NEGATIVE    Leukocytes,Ua NEGATIVE NEGATIVE   RBC / HPF 0-5 0 - 5 RBC/hpf   WBC, UA 6-10 0 - 5 WBC/hpf   Bacteria, UA RARE (A) NONE SEEN   Squamous Epithelial / LPF 0-5 0 - 5   Mucus PRESENT    Hyaline Casts, UA PRESENT   Pregnancy, urine  Result Value Ref Range   Preg Test, Ur NEGATIVE NEGATIVE  Urine Drug Screen, Qualitative  Result Value Ref Range   Tricyclic, Ur Screen POSITIVE (A) NONE DETECTED   Amphetamines, Ur Screen NONE DETECTED NONE DETECTED   MDMA (Ecstasy)Ur Screen NONE DETECTED NONE DETECTED   Cocaine Metabolite,Ur [place] NONE DETECTED NONE DETECTED   Opiate, Ur Screen NONE DETECTED NONE DETECTED   Phencyclidine (PCP) Ur S NONE DETECTED NONE DETECTED   Cannabinoid 50 Ng, Ur [place] POSITIVE (A) NONE DETECTED   Barbiturates, Ur Screen NONE DETECTED NONE DETECTED   Benzodiazepine, Ur Scrn NONE DETECTED NONE DETECTED   Methadone Scn, Ur NONE DETECTED NONE DETECTED  Pregnancy, urine POC  Result Value Ref Range   Preg Test, Ur NEGATIVE NEGATIVE  Troponin I (High Sensitivity)  Result Value Ref Range   Troponin I (High Sensitivity) <2 <18 ng/L      Assessment & Plan:   Problem List Items Addressed This Visit      Cardiovascular and Mediastinum   Syncope    New episodes, seen in ER with normal  CT head and unremarkable blood work.  Psychiatrist concerned for seizures.  ? Seizure disorder vs possible tardive dyskinesia -- she is on Trileptal for Bipolar and Seroquel.  No red flags on exam today.  Urgent referral to neurology placed.  Labs today to include CBC, BMP, TSH, RPR, HIV.  Return in 6 weeks for follow-up, sooner for worsening symptoms.  Immediately go to ER if active seizure presents.        Relevant Orders   TSH   Basic metabolic panel   CBC with Differential/Platelet   RPR   HIV Antibody (routine testing w rflx)   Ambulatory referral to Neurology     Other   Bipolar I disorder (HCC) - Primary    Chronic, ongoing without SI/HI at this time.  Continue to collaborate with  psychiatry at [facility].  Continue current medication regimen at this time as prescribed by them, recent visit this morning with them during which she reports they recommended she see neuro.  Obtain labs today.  Return in 6 weeks.          Follow up plan: Return in about 6 weeks (around 08/04/2020) for Syncope.

## 2020-06-24 LAB — CBC WITH DIFFERENTIAL/PLATELET
Basophils Absolute: 0 10*3/uL (ref 0.0–0.2)
Basos: 1 %
EOS (ABSOLUTE): 0.1 10*3/uL (ref 0.0–0.4)
Eos: 2 %
Hematocrit: 39.6 % (ref 34.0–46.6)
Hemoglobin: 13.1 g/dL (ref 11.1–15.9)
Immature Grans (Abs): 0 10*3/uL (ref 0.0–0.1)
Immature Granulocytes: 0 %
Lymphocytes Absolute: 1.7 10*3/uL (ref 0.7–3.1)
Lymphs: 36 %
MCH: 29.4 pg (ref 26.6–33.0)
MCHC: 33.1 g/dL (ref 31.5–35.7)
MCV: 89 fL (ref 79–97)
Monocytes Absolute: 0.3 10*3/uL (ref 0.1–0.9)
Monocytes: 7 %
Neutrophils Absolute: 2.5 10*3/uL (ref 1.4–7.0)
Neutrophils: 54 %
Platelets: 248 10*3/uL (ref 150–450)
RBC: 4.45 x10E6/uL (ref 3.77–5.28)
RDW: 12.3 % (ref 11.7–15.4)
WBC: 4.6 10*3/uL (ref 3.4–10.8)

## 2020-06-24 LAB — BASIC METABOLIC PANEL
BUN/Creatinine Ratio: 14 (ref 9–23)
BUN: 10 mg/dL (ref 6–20)
CO2: 21 mmol/L (ref 20–29)
Calcium: 9 mg/dL (ref 8.7–10.2)
Chloride: 100 mmol/L (ref 96–106)
Creatinine, Ser: 0.7 mg/dL (ref 0.57–1.00)
GFR calc Af Amer: 144 mL/min/{1.73_m2} (ref >59)
GFR calc non Af Amer: 125 mL/min/{1.73_m2} (ref >59)
Glucose: 80 mg/dL (ref 65–99)
Potassium: 4.4 mmol/L (ref 3.5–5.2)
Sodium: 138 mmol/L (ref 134–144)

## 2020-06-24 LAB — TSH: TSH: 2.01 u[IU]/mL (ref 0.450–4.500)

## 2020-06-24 LAB — RPR: RPR Ser Ql: NONREACTIVE

## 2020-06-24 LAB — HIV ANTIBODY (ROUTINE TESTING W REFLEX): HIV Screen 4th Generation wRfx: NONREACTIVE

## 2020-06-24 NOTE — Progress Notes (Signed)
Contacted via MyChart  Good morning Kareli, all of your labs have returned and are within normal ranges.  No concerns on these.  Great news.  Have a wonderful day!! Keep being awesome!! Kindest regards, Kasidee Voisin

## 2020-06-25 ENCOUNTER — Other Ambulatory Visit: Payer: Self-pay

## 2020-06-25 ENCOUNTER — Encounter: Payer: Self-pay | Admitting: Neurology

## 2020-06-25 ENCOUNTER — Ambulatory Visit: Payer: Medicaid Other | Admitting: Neurology

## 2020-06-25 VITALS — BP 109/69 | HR 83 | Ht 65.5 in | Wt 112.0 lb

## 2020-06-25 DIAGNOSIS — R251 Tremor, unspecified: Secondary | ICD-10-CM | POA: Insufficient documentation

## 2020-06-25 DIAGNOSIS — H53143 Visual discomfort, bilateral: Secondary | ICD-10-CM | POA: Insufficient documentation

## 2020-06-25 DIAGNOSIS — F319 Bipolar disorder, unspecified: Secondary | ICD-10-CM

## 2020-06-25 DIAGNOSIS — R55 Syncope and collapse: Secondary | ICD-10-CM | POA: Insufficient documentation

## 2020-06-25 NOTE — Progress Notes (Signed)
Provider:  Melvyn Novas, M D  Referring Provider: Marjie Skiff, NP Primary Care Physician:  Marjie Skiff, NP  Chief Complaint  Patient presents with  . New Patient (Initial Visit)    pt alone, rm 10. presents today for follow up visit post PCP visit and ER. she states that last thur is when she developed these random events. 8/5 manager saw her at work with a tremor and rocking back/forth-pt does not recall the event. 8/6 called pcp they advised ER- in ER they ? synsope?, after dc out of ER she was in store with friend and had vomitting episode, became shakey and staring off- friend would try and keep her attention but she was unable to verbally respond. she had 2 fainting spells.   . Other    EMS called and Vitals and work up was ok with exception of 104.5. sat had few spells and sunday she could feel it coming on as she started to stare off again, fam member witnessed and tried to get her attention and she said she began to cry, they laid her down on couch and she states that she felt like she was trying to get up but couldnt move.    HPI:  Erica Long is a 19 y.o. female  Is seen here as an urgent  Referral from NP [facility] for near syncope.   Erica Long is a right-handed Caucasian female patient with a history of bipolar disorder, she receives primary care through [facility] but she has been seen since childhood.  She is followed by [facility] in [facility] and has been taking Trileptal 300 mg twice daily and Seroquel 100 mg at night to help her sleep.  On 6 August she experienced a near syncope spell this was not the first there was a prior previous one on Thursday preceding that on Friday she had a total of 2 spells which made her called EMS.  With her first spell she had visited the ED but due to a very long wait turned away before she was seen.  The EMS evaluated her and found normal vital signs except for an elevated body  temperature the body temperature supposedly was 104.5 F she had normal blood sugar normal oxygenation normal blood pressure and heart rate was regular at 81 bpm.  They suspected that the panic anxiety attack may have led to her spell the fever was not felt further evaluated and her EKG was with him which did not show abnormalities either.  Following the EMS visit on 6 August she was seen at [facility] on the 10th the same morning she had seen psychiatry.  She had been to the ER on 8 August without being seen.  The psychiatrist had her Seroquel in half so she only took 50 mg at night hoping that this would be helping was trembling and twitching.  Spells that are including twitching and trembling have lasted up to 3 hours.  The patient did not report any abnormal mood changes for many months preceding the spells.   The psychiatrist must have mentioned a concern about seizure disorder and recommended a neurology referral.    Primary care look for explanation for the fever but could not find an abnormality either,  spells are described as intense twitching and muscle spasms on Sunday the episode started with staring attack.  There was no shortness of breath no incontinence chest pain, headache loss of vision  impairment of speech and no tongue bite.  Objective evaluation at her primary care showed a temperature of 98.5 Fahrenheit orally.  Lab test did not show any indication of an inflammatory disease, infection and her sodium potassium and chloride were all normal limits as were her kidney function and calcium levels.  White blood cell count was 5.3 so it is unlikely that she suffered an infection.  There was also no abnormal pattern of distribution between positive fields and granulocytes and lymphocytes.  Urine test was negative for infection.  Pregnancy test was negative.  Tricyclic medication which is prescribed for the patient checked out positive also cannabinoids cannabinoids.  Troponin was  negative.  Her primary care also wrote for a thyroid scan and basic metabolic panel, and an RPR and HIV test.  There have been no spells since the Sunday.  During the emergency room visit on 6 August a CT of the head without contrast was normal.  Clinical evaluation did not reveal any one-sided weakness although the patient stated that she felt her left was weak.  I also reviewed her medications she is currently on Zyrtec, Flonase, Trileptal, Seroquel, Depakote, hydroxyzine, Depo-Provera, Medrol Dosepak which has been tapered off, and labs in the emergency room were also negative for a urinary tract infection or elevated white blood cell count.  Review of Systems: Out of a complete 14 system review, the patient complains of only the following symptoms, and all other reviewed systems are negative.  twitching , trembling, spasms, restless, jittery.  Bipolar ,  Eating disorder.     Social History   Socioeconomic History  . Marital status: Single    Spouse name: Not on file  . Number of children: Not on file  . Years of education: Not on file  . Highest education level: Not on file  Occupational History  . Not on file  Tobacco Use  . Smoking status: Current Every Day Smoker    Types: E-cigarettes  . Smokeless tobacco: Never Used  Vaping Use  . Vaping Use: Every day  . Substances: Nicotine  Substance and Sexual Activity  . Alcohol use: Not Currently  . Drug use: Yes    Types: Marijuana    Comment: daily use  . Sexual activity: Not Currently  Other Topics Concern  . Not on file  Social History Narrative  . Not on file   Social Determinants of Health   Financial Resource Strain:   . Difficulty of Paying Living Expenses:   Food Insecurity:   . Worried About Programme researcher, broadcasting/film/videounning Out of Food in the Last Year:   . Baristaan Out of Food in the Last Year:   Transportation Needs:   . Freight forwarderLack of Transportation (Medical):   Marland Kitchen. Lack of Transportation (Non-Medical):   Physical Activity:   . Days of  Exercise per Week:   . Minutes of Exercise per Session:   Stress:   . Feeling of Stress :   Social Connections:   . Frequency of Communication with Friends and Family:   . Frequency of Social Gatherings with Friends and Family:   . Attends Religious Services:   . Active Member of Clubs or Organizations:   . Attends BankerClub or Organization Meetings:   Marland Kitchen. Marital Status:   Intimate Partner Violence:   . Fear of Current or Ex-Partner:   . Emotionally Abused:   Marland Kitchen. Physically Abused:   . Sexually Abused:     Family History  Problem Relation Age of Onset  . Bipolar disorder  Mother   . Depression Mother   . Heart attack Maternal Grandmother   . COPD Paternal Grandmother   . Depression Paternal Grandmother     Past Medical History:  Diagnosis Date  . Bipolar 1 disorder [facility])     Past Surgical History:  Procedure Laterality Date  . WISDOM TOOTH EXTRACTION      Current Outpatient Medications  Medication Sig Dispense Refill  . fluticasone (FLONASE) 50 MCG/ACT nasal spray Place 1 spray into both nostrils daily.    . medroxyPROGESTERone (DEPO-PROVERA) 150 MG/ML injection Inject into the muscle.    . Oxcarbazepine (TRILEPTAL) 300 MG tablet Take 300 mg by mouth 2 (two) times daily.    . QUEtiapine (SEROQUEL) 100 MG tablet Take 100 mg by mouth at bedtime.     No current facility-administered medications for this visit.    Allergies as of 06/25/2020  . (No Known Allergies)    Vitals: BP 109/69   Pulse 83   Ht 5' 5.5" (1.664 m)   Wt 112 lb (50.8 kg)   BMI 18.35 kg/m  Last Weight:  Wt Readings from Last 1 Encounters:  06/25/20 112 lb (50.8 kg)   Last Height:   Ht Readings from Last 1 Encounters:  06/25/20 5' 5.5" (1.664 m)    Physical exam:  General: The patient is awake, alert and appears not in acute distress. The patient is well groomed. Head: Normocephalic, atraumatic. Neck is supple. Mallampati: 1, neck circumference:13.5" Cardiovascular:  Regular rate and rhythm ,  without  murmurs or carotid bruit, and without distended neck veins. Respiratory: Lungs are clear to auscultation. Skin:  Without evidence of edema, or rash- many monochromic tattooes.  Trunk: BMI is 19.27 kg/m2.   Neurologic exam : The patient is awake and alert, oriented to place and time.  Memory subjective  described as intact. There is a normal attention span & concentration ability. Speech is fluent without   dysarthria, dysphonia or aphasia. Mood and affect are appropriate.  Cranial nerves: Pupils are equal and briskly reactive to light. Funduscopic exam without evidence of pallor or edema. Extraocular movements  in vertical and horizontal planes intact and without nystagmus.  Visual fields by finger perimetry are intact. Hearing to finger rub intact.  Facial sensation intact to fine touch. Facial motor strength is symmetric and tongue and uvula move midline. Tongue protrusion into either cheek is normal. Shoulder shrug is normal.   Motor exam:   Normal tone ,muscle bulk and symmetric  strength in all extremities.  Sensory:  Fine touch, pinprick and vibration were tested in all extremities. Proprioception was normal.  Coordination: Rapid alternating movements in the fingers/hands were normal.  Finger-to-nose maneuver normal without evidence of ataxia, but dysmetria- not  Tremor. This is functional, non organic.   Gait and station: Patient walks without assistive device and is unassisted .  Appears robotic at first, but no astasia, abasia. Strength within normal limits.  Stance is stable and normal. Turns with 3 steps, and doesn't drift.  Tandem gait is unfragmented. Romberg testing is negative   Deep tendon reflexes: in the upper and lower extremities are symmetric and intact. Babinski maneuver response was deferred.    Assessment:  45 minutes-  After physical and neurologic examination, review of laboratory studies, imaging, neurophysiology testing and pre-existing records,  assessment is that of :  POSSIBLE DISSOCIATIVE DISORDER   Near syncope without orthostatic compenent, no preceding symptoms of panic or anxiety.  The temperature as measured by EMS is a red  herring. No UTI, no WBC .   Shaking, trembling without identifiable trigger. Tuesday, 48 hours ago ) Seroquel reduced to 50 mg, no effect either way.   A period of 3 hours of trembling is unlikely organic.  I would not recommend to change to depakote, given the side effect panel and fetotoxicity.  Plan:  Treatment plan and additional workup :  I will order an EEG. Follow up only if abnormal.  I do not request an MRI .  My clinical suspecion is for a non-organic cause. Seizure is unlikely, but we will do EEG to help differentiate.     Porfirio Mylar Daylynn Stumpp MD 06/25/2020

## 2020-06-26 DIAGNOSIS — F3181 Bipolar II disorder: Secondary | ICD-10-CM | POA: Diagnosis not present

## 2020-07-14 ENCOUNTER — Ambulatory Visit: Payer: Medicaid Other

## 2020-07-14 ENCOUNTER — Other Ambulatory Visit: Payer: Self-pay

## 2020-07-14 ENCOUNTER — Encounter: Payer: Self-pay | Admitting: Nurse Practitioner

## 2020-07-14 ENCOUNTER — Ambulatory Visit (INDEPENDENT_AMBULATORY_CARE_PROVIDER_SITE_OTHER): Payer: Medicaid Other | Admitting: Nurse Practitioner

## 2020-07-14 VITALS — BP 104/68 | HR 73 | Temp 98.5°F | Wt 114.8 lb

## 2020-07-14 DIAGNOSIS — R404 Transient alteration of awareness: Secondary | ICD-10-CM

## 2020-07-14 DIAGNOSIS — Z3009 Encounter for other general counseling and advice on contraception: Secondary | ICD-10-CM | POA: Diagnosis not present

## 2020-07-14 DIAGNOSIS — F319 Bipolar disorder, unspecified: Secondary | ICD-10-CM

## 2020-07-14 DIAGNOSIS — R55 Syncope and collapse: Secondary | ICD-10-CM

## 2020-07-14 DIAGNOSIS — R251 Tremor, unspecified: Secondary | ICD-10-CM

## 2020-07-14 DIAGNOSIS — H53143 Visual discomfort, bilateral: Secondary | ICD-10-CM

## 2020-07-14 MED ORDER — MEDROXYPROGESTERONE ACETATE 150 MG/ML IM SUSP
150.0000 mg | Freq: Once | INTRAMUSCULAR | Status: AC
  Administered 2020-07-14: 150 mg via INTRAMUSCULAR

## 2020-07-14 NOTE — Patient Instructions (Signed)
Cognitive Behavioral Therapy Cognitive behavioral therapy (CBT) is a short-term, goal-oriented type of talk therapy. CBT can help you:  Identify patterns of thinking, feeling, and behaving that are causing you problems.  Decide how you want to think, feel, and respond to life events.  Set goals to change the beliefs and thoughts that cause you to act in ways that are not helpful for you.  Follow up on the changes that you make. What are the different types of CBT? The different types of CBT include:  Dialectical behavioral therapy (DBT). This approach is often used in group therapy, and it aids a person in managing behavior by focusing on: ? Things that cause problems to start (triggers). ? Methods of self-calming. ? Re-evaluating thinking processes.  Mindfulness-based cognitive therapy. This approach involves focusing your attention, meditating, and developing awareness of the present moment (mindfulness).  Rational emotive behavior therapy. This approach uses rational thought to reframe your thinking so it is less judgmental. Your therapist may directly challenge your thought processes.  Stress inoculation training. This approach involves planning ahead for stressful situations by practicing new thoughts and behaviors. This planning can help you avoid going back to old actions.  Acceptance and commitment therapy (ACT). This approach focuses on accepting yourself as you are and practicing mindfulness. It helps you understand what you would like to change and how you can set goals in that direction. What conditions is CBT used to treat? CBT may help to treat:  Mental health conditions, including: ? Depression. ? Anxiety. ? Bipolar disorder. ? Eating disorders. ? Post-traumatic stress disorder (PTSD). ? Obsessive-compulsive disorder (OCD).  Insomnia and other sleep disorders.  Pain.  Stress.  Coping with loss or grief.  Coping with a difficult medical diagnosis or  illness.  Relationship problems.  Emotional distress or shock (trauma). How can CBT help me? CBT may:  Give you a chance to share your thoughts, feelings, problems, and fears in a safe space.  Help you focus on specific problems.  Give you homework that helps you put theory into practice. Homework may include keeping a journal or doing thinking exercises.  Help you become aware of your patterns of thinking, feeling, and behaving, and how those three patterns affect each other.  Change your thoughts so that you can change your behaviors.  Help you chose how you want to view the world.  Teach you planned coping skills and offer better ways to deal with stress and difficult situations. To make the most of CBT, make sure you:  Find a licensed therapist whom you trust.  Take an active part in your therapy and do the homework that you are given.  Are honest about your problems.  Avoid skipping your therapy sessions. Summary  Cognitive behavioral therapy (CBT) is a short-term, goal-oriented type of talk therapy.  CBT can help you become aware of your patterns and the relationships among your thoughts, feelings, and behavior.  CBT may help mental health conditions and other problems. This information is not intended to replace advice given to you by your health care provider. Make sure you discuss any questions you have with your health care provider. Document Revised: 07/24/2019 Document Reviewed: 03/14/2017 Elsevier Patient Education  2020 [employer]  
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
## 2020-07-15 LAB — PREGNANCY, URINE: Preg Test, Ur: NEGATIVE

## 2020-07-17 DIAGNOSIS — F3181 Bipolar II disorder: Secondary | ICD-10-CM | POA: Diagnosis not present

## 2020-07-17 DIAGNOSIS — F419 Anxiety disorder, unspecified: Secondary | ICD-10-CM | POA: Diagnosis not present

## 2020-07-23 ENCOUNTER — Telehealth: Payer: Self-pay | Admitting: Nurse Practitioner

## 2020-07-23 NOTE — Telephone Encounter (Signed)
Noted, yes she will need to connect psychiatry for this script.  Thank you.

## 2020-07-23 NOTE — Telephone Encounter (Signed)
Called and spoke with patient. Patient stated that she will need a refill on Seroquel but her insurance asked her to contact her doctor as it is too soon for a refill. Pt states she is running out of medication because her psychiatry increased the mg from 100 mg at bed time to 200 mg at bed time. Advised patient that she will need to contact her psychiatry for refill or any other concerns since they prescribed and increased this medication to patient. 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

## 2020-07-23 NOTE — Telephone Encounter (Signed)
Copied from CRM [id number]. Topic: General - Call Back - No Documentation >> Jul 23, 2020  3:41 PM Randol Kern wrote: Pt called to report that the pharmacy has asked her to contact her PCP regarding Seroquel/her insurance. She states that all her psychiatrist did was change the dosage for her Rx.  Best contact: [phone]

## 2020-07-30 ENCOUNTER — Telehealth: Payer: Self-pay | Admitting: *Deleted

## 2020-07-30 DIAGNOSIS — R404 Transient alteration of awareness: Secondary | ICD-10-CM | POA: Insufficient documentation

## 2020-07-30 NOTE — Telephone Encounter (Signed)
Called, LVM for pt to let her know EEG normal. Advised her to call if she has any new or worsening sx in the furture.

## 2020-07-30 NOTE — Telephone Encounter (Signed)
-----   Message from Melvyn Novas, MD sent at 07/30/2020 12:49 PM EDT ----- Normal EEG

## 2020-07-30 NOTE — Procedures (Signed)
This is a EEG for the patient Erica Long was performed using the international electrode placement system also known as 10-20 electrode system.  The EEG recording has a length of 25 minutes and 38 seconds.  A posterior dominant occipital rhythm of 10 Hz has been seen symmetrically over both occipital poles, promptly attenuating with eye opening.  This is associated with a regular EKG in sinus rhythm.  Visible before any stimulation maneuvers were performed with some bifrontal slowing.  Photic stimulation was performed at frequencies between 1 and 13 Hz.  There was photic entrainment noted up to 15 Hz stimulation higher frequencies did not lead to a discernible change in posterior dominant rhythm.  Hyperventilation led to the appropriate physiological response of slowing with amplitude buildup.   No epileptiform activity arose.  This is a normal EEG for the patient's age and conscious state.  Melvyn Novas, MD

## 2020-07-30 NOTE — Progress Notes (Signed)
Normal EEG

## 2020-08-04 ENCOUNTER — Ambulatory Visit: Payer: Medicaid Other | Admitting: Nurse Practitioner

## 2020-09-10 DIAGNOSIS — F3181 Bipolar II disorder: Secondary | ICD-10-CM | POA: Diagnosis not present

## 2020-09-17 ENCOUNTER — Ambulatory Visit (INDEPENDENT_AMBULATORY_CARE_PROVIDER_SITE_OTHER): Payer: Medicaid Other | Admitting: Family Medicine

## 2020-09-17 ENCOUNTER — Encounter: Payer: Self-pay | Admitting: Family Medicine

## 2020-09-17 VITALS — BP 115/82 | HR 81 | Ht 65.0 in | Wt 116.0 lb

## 2020-09-17 DIAGNOSIS — F319 Bipolar disorder, unspecified: Secondary | ICD-10-CM | POA: Diagnosis not present

## 2020-09-17 DIAGNOSIS — R55 Syncope and collapse: Secondary | ICD-10-CM

## 2020-09-17 NOTE — Patient Instructions (Signed)
Below is our plan:  We will continue monitoring symptoms. Please follow up closely with PCP and psychiatry. Consider DBT if psychiatry recommends.   Please make sure you are staying well hydrated. I recommend 50-60 ounces daily. I recommend you have two 15 minute breaks with one 30 minute lunch period while working 8 hour shift to ensure appropriate hydration and regular meals. Well balanced diet and regular exercise encouraged.    Please continue follow up with care team as directed.   Follow up with Dr Vickey Huger in 3-4 months   You may receive a survey regarding today's visit. I encourage you to leave honest feed back as I do use this information to improve patient care. Thank you for seeing me today!     Seizure, Adult A seizure is a sudden burst of abnormal electrical activity in the brain. Seizures usually last from 30 seconds to 2 minutes. They can cause many different symptoms. Usually, seizures are not harmful unless they last a long time. What are the causes? Common causes of this condition include:  Fever or infection.  Conditions that affect the brain, such as: ? A brain abnormality that you were born with. ? A brain or head injury. ? Bleeding in the brain. ? A tumor. ? Stroke. ? Brain disorders such as autism or cerebral palsy.  Low blood sugar.  Conditions that are passed from parent to child (are inherited).  Problems with substances, such as: ? Having a reaction to a drug or a medicine. ? Suddenly stopping the use of a substance (withdrawal). In some cases, the cause may not be known. A person who has repeated seizures over time without a clear cause has a condition called epilepsy. What increases the risk? You are more likely to get this condition if you have:  A family history of epilepsy.  Had a seizure in the past.  A brain disorder.  A history of head injury, lack of oxygen at birth, or strokes. What are the signs or symptoms? There are many  types of seizures. The symptoms vary depending on the type of seizure you have. Examples of symptoms during a seizure include:  Shaking (convulsions).  Stiffness in the body.  Passing out (losing consciousness).  Head nodding.  Staring.  Not responding to sound or touch.  Loss of bladder control and bowel control. Some people have symptoms right before and right after a seizure happens. Symptoms before a seizure may include:  Fear.  Worry (anxiety).  Feeling like you may vomit (nauseous).  Feeling like the room is spinning (vertigo).  Feeling like you saw or heard something before (dj vu).  Odd tastes or smells.  Changes in how you see. You may see flashing lights or spots. Symptoms after a seizure happens can include:  Confusion.  Sleepiness.  Headache.  Weakness on one side of the body. How is this treated? Most seizures will stop on their own in under 5 minutes. In these cases, no treatment is needed. Seizures that last longer than 5 minutes will usually need treatment. Treatment can include:  Medicines given through an IV tube.  Avoiding things that are known to cause your seizures. These can include medicines that you take for another condition.  Medicines to treat epilepsy.  Surgery to stop the seizures. This may be needed if medicines do not help. Follow these instructions at home: Medicines  Take over-the-counter and prescription medicines only as told by your doctor.  Do not eat or drink anything that  may keep your medicine from working, such as alcohol. Activity  Do not do any activities that would be dangerous if you had another seizure, like driving or swimming. Wait until your doctor says it is safe for you to do them.  If you live in the [place] ask your local DMV (department of motor vehicles) when you can drive.  Get plenty of rest. Teaching others Teach friends and family what to do when you have a seizure. They should:  Lay you on  the ground.  Protect your head and body.  Loosen any tight clothing around your neck.  Turn you on your side.  Not hold you down.  Not put anything into your mouth.  Know whether or not you need emergency care.  Stay with you until you are better.  General instructions  Contact your doctor each time you have a seizure.  Avoid anything that gives you seizures.  Keep a seizure diary. Write down: ? What you think caused each seizure. ? What you remember about each seizure.  Keep all follow-up visits as told by your doctor. This is important. Contact a doctor if:  You have another seizure.  You have seizures more often.  There is any change in what happens during your seizures.  You keep having seizures with treatment.  You have symptoms of being sick or having an infection. Get help right away if:  You have a seizure that: ? Lasts longer than 5 minutes. ? Is different than seizures you had before. ? Makes it harder to breathe. ? Happens after you hurt your head.  You have any of these symptoms after a seizure: ? Not being able to speak. ? Not being able to use a part of your body. ? Confusion. ? A bad headache.  You have two or more seizures in a row.  You do not wake up right after a seizure.  You get hurt during a seizure. These symptoms may be an emergency. Do not wait to see if the symptoms will go away. Get medical help right away. Call your local emergency services (911 in the [place] Do not drive yourself to the hospital. Summary  Seizures usually last from 30 seconds to 2 minutes. Usually, they are not harmful unless they last a long time.  Do not eat or drink anything that may keep your medicine from working, such as alcohol.  Teach friends and family what to do when you have a seizure.  Contact your doctor each time you have a seizure. This information is not intended to replace advice given to you by your health care provider. Make sure you  discuss any questions you have with your health care provider. Document Revised: 01/18/2019 Document Reviewed: 01/18/2019 Elsevier Patient Education  2020 [employer].
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
## 2020-10-12 DIAGNOSIS — F419 Anxiety disorder, unspecified: Secondary | ICD-10-CM | POA: Diagnosis not present

## 2020-10-12 DIAGNOSIS — F3181 Bipolar II disorder: Secondary | ICD-10-CM | POA: Diagnosis not present

## 2020-10-13 ENCOUNTER — Other Ambulatory Visit: Payer: Self-pay

## 2020-10-13 ENCOUNTER — Ambulatory Visit (INDEPENDENT_AMBULATORY_CARE_PROVIDER_SITE_OTHER): Payer: Medicaid Other | Admitting: Nurse Practitioner

## 2020-10-13 ENCOUNTER — Encounter: Payer: Self-pay | Admitting: Nurse Practitioner

## 2020-10-13 VITALS — BP 114/74 | HR 75 | Temp 97.8°F | Wt 113.4 lb

## 2020-10-13 DIAGNOSIS — Z3009 Encounter for other general counseling and advice on contraception: Secondary | ICD-10-CM

## 2020-10-13 DIAGNOSIS — F319 Bipolar disorder, unspecified: Secondary | ICD-10-CM

## 2020-10-13 MED ORDER — MEDROXYPROGESTERONE ACETATE 150 MG/ML IM SUSP
150.0000 mg | Freq: Once | INTRAMUSCULAR | Status: AC
  Administered 2020-10-13: 150 mg via INTRAMUSCULAR

## 2020-10-13 NOTE — Patient Instructions (Signed)

## 2020-10-13 NOTE — Assessment & Plan Note (Signed)
Depo today, pregnancy test next in August 2022.

## 2020-10-13 NOTE — Assessment & Plan Note (Signed)
Chronic, ongoing without SI/HI at this time.  Continue to collaborate with psychiatry at [facility].  Continue current medication regimen at this time as prescribed by them, recent visit this yesterday with them.   Return in August 2022 for follow-up.

## 2020-10-13 NOTE — Progress Notes (Signed)
BP 114/74   Pulse 75   Temp 97.8 F (36.6 C)   Wt 113 lb 6.4 oz (51.4 kg)   SpO2 97%   BMI 18.87 kg/m    Subjective:    Patient ID: Erica Long, female    DOB: 01/13/2001, 19 y.o.   MRN: [id number]  HPI: Erica Long is a 19 y.o. female  Chief Complaint  Patient presents with  . Mood  . Contraception   ANXIETY/STRESS Is being followed by [facility] in [place], last saw yesterday. Is taking Trileptal 300 MG BID and Seroquel 200 MG QHS for Bipolar Disorder. She reports this works well for her.  Has not started therapy as of yet. Duration:stable Anxious mood: no  Excessive worrying: no Irritability: no  Sweating: no Nausea: no Palpitations:no Hyperventilation: no Panic attacks: no Agoraphobia: no  Obscessions/compulsions: no Depressed mood: no Depression screen [facility] 2/9 10/13/2020 06/23/2020 12/31/2018 11/28/2018 11/20/2018  Decreased Interest 2 3 1 1 3   Down, Depressed, Hopeless 1 1 1 1 3   PHQ - 2 Score 3 4 2 2 6   Altered sleeping 2 2 0 0 2  Tired, decreased energy 2 3 0 0 3  Change in appetite 3 3 0 2 0  Feeling bad or failure about yourself  1 0 0 0 3  Trouble concentrating 1 1 1 1 3   Moving slowly or fidgety/restless 0 2 0 0 3  Suicidal thoughts 0 0 0 0 1  PHQ-9 Score 12 15 3 5 21   Difficult doing work/chores Somewhat difficult Somewhat difficult Somewhat difficult Somewhat difficult Very difficult   Anhedonia: no Weight changes: no Insomnia: none Hypersomnia: no Fatigue/loss of energy: no Feelings of worthlessness: no Feelings of guilt: no Impaired concentration/indecisiveness: no Suicidal ideations: no  Crying spells: no Recent Stressors/Long Changes: no   Relationship problems: no   Family stress: no     Financial stress: no    Job stress: no    Recent death/loss: no  GAD 7 : Generalized Anxiety Score 10/13/2020 06/23/2020 11/20/2018 09/24/2018  Nervous, Anxious, on Edge 3 3 2 2   Control/stop worrying 3 2 3 3   Worry too much -  different things 3 2 3 3   Trouble relaxing 2 1 3 2   Restless 1 1 0 2  Easily annoyed or irritable 3 1 3 3   Afraid - awful might happen 1 1 3 3   Total GAD 7 Score 16 11 17 18   Anxiety Difficulty Somewhat difficult Somewhat difficult Very difficult Somewhat difficult    Relevant past medical, surgical, family and social history reviewed and updated as indicated. Interim medical history since our last visit reviewed. Allergies and medications reviewed and updated.  Review of Systems  Constitutional: Negative for activity change, appetite change, diaphoresis, fatigue and fever.  Respiratory: Negative for cough, chest tightness and shortness of breath.   Cardiovascular: Negative for chest pain, palpitations and leg swelling.  Gastrointestinal: Negative.   Neurological: Negative.   Psychiatric/Behavioral: Negative for decreased concentration, self-injury, sleep disturbance and suicidal ideas. The patient is nervous/anxious.     Per HPI unless specifically indicated above     Objective:    BP 114/74   Pulse 75   Temp 97.8 F (36.6 C)   Wt 113 lb 6.4 oz (51.4 kg)   SpO2 97%   BMI 18.87 kg/m   Wt Readings from Last 3 Encounters:  10/13/20 113 lb 6.4 oz (51.4 kg)  09/17/20 116 lb (52.6 kg)  07/14/20 114 lb 12.8 oz (  52.1 kg)    Physical Exam Vitals and nursing note reviewed.  Constitutional:      General: She is awake. She is not in acute distress.    Appearance: She is well-developed and well-groomed. She is not ill-appearing.  HENT:     Head: Normocephalic.     Right Ear: Hearing normal.     Left Ear: Hearing normal.  Eyes:     General: Lids are normal.        Right eye: No discharge.        Left eye: No discharge.     Pupils: Pupils are equal, round, and reactive to light.  Neck:     Thyroid: No thyromegaly.     Vascular: No carotid bruit.  Cardiovascular:     Rate and Rhythm: Normal rate and regular rhythm.     Heart sounds: Normal heart sounds. No murmur heard.   No gallop.   Pulmonary:     Effort: Pulmonary effort is normal. No accessory muscle usage or respiratory distress.     Breath sounds: Normal breath sounds.  Abdominal:     General: Bowel sounds are normal.     Palpations: Abdomen is soft.  Musculoskeletal:     Cervical back: Normal range of motion and neck supple.     Right lower leg: No edema.     Left lower leg: No edema.  Skin:    General: Skin is warm and dry.  Neurological:     Mental Status: She is alert and oriented to person, place, and time.  Psychiatric:        Attention and Perception: Attention normal.        Mood and Affect: Mood normal.        Speech: Speech normal.        Behavior: Behavior normal. Behavior is cooperative.        Thought Content: Thought content normal.    Results for orders placed or performed in visit on 07/14/20  Pregnancy, urine  Result Value Ref Range   Preg Test, Ur Negative Negative      Assessment & Plan:   Problem List Items Addressed This Visit      Other   Bipolar I disorder (HCC) - Primary    Chronic, ongoing without SI/HI at this time.  Continue to collaborate with psychiatry at [facility].  Continue current medication regimen at this time as prescribed by them, recent visit this yesterday with them.   Return in August 2022 for follow-up.      Birth control counseling    Depo today, pregnancy test next in August 2022.          Follow up plan: Return in about 37 weeks (around 06/29/2021) for MOOD visit and needs pregnancy test for Depo.

## 2020-11-10 DIAGNOSIS — F3181 Bipolar II disorder: Secondary | ICD-10-CM | POA: Diagnosis not present

## 2020-11-10 DIAGNOSIS — Z79899 Other long term (current) drug therapy: Secondary | ICD-10-CM | POA: Diagnosis not present

## 2020-11-10 DIAGNOSIS — F419 Anxiety disorder, unspecified: Secondary | ICD-10-CM | POA: Diagnosis not present

## 2020-12-03 ENCOUNTER — Other Ambulatory Visit: Payer: Self-pay

## 2020-12-03 ENCOUNTER — Ambulatory Visit (INDEPENDENT_AMBULATORY_CARE_PROVIDER_SITE_OTHER): Payer: Medicaid Other | Admitting: Unknown Physician Specialty

## 2020-12-03 ENCOUNTER — Encounter: Payer: Self-pay | Admitting: Unknown Physician Specialty

## 2020-12-03 VITALS — BP 133/85 | HR 79 | Temp 98.3°F | Wt 116.0 lb

## 2020-12-03 DIAGNOSIS — Z7251 High risk heterosexual behavior: Secondary | ICD-10-CM | POA: Diagnosis not present

## 2020-12-03 LAB — PREGNANCY, URINE: Preg Test, Ur: NEGATIVE

## 2020-12-03 NOTE — Progress Notes (Signed)
   BP 133/85   Pulse 79   Temp 98.3 F (36.8 C)   Wt 116 lb (52.6 kg)   SpO2 100%   BMI 19.30 kg/m    Subjective:    Patient ID: Erica Long, female    DOB: 2001/03/22, 19 y.o.   MRN: [id number]  HPI: Erica Long is a 20 y.o. female  No chief complaint on file.  Pt is here to get STD tests.  Pt had unprotected test and was told he was positive for something but doesn't know what.  No vaginal discharge, pelvic pain, or fever.  She is on depo and has been spotting.    Relevant past medical, surgical, family and social history reviewed and updated as indicated. Interim medical history since our last visit reviewed. Allergies and medications reviewed and updated.  Review of Systems  Per HPI unless specifically indicated above     Objective:    BP 133/85   Pulse 79   Temp 98.3 F (36.8 C)   Wt 116 lb (52.6 kg)   SpO2 100%   BMI 19.30 kg/m   Wt Readings from Last 3 Encounters:  12/03/20 116 lb (52.6 kg)  10/13/20 113 lb 6.4 oz (51.4 kg)  09/17/20 116 lb (52.6 kg)    Physical Exam Constitutional:      General: She is not in acute distress.    Appearance: Normal appearance. She is well-developed and well-nourished.  HENT:     Head: Normocephalic and atraumatic.  Eyes:     General: Lids are normal. No scleral icterus.       Right eye: No discharge.        Left eye: No discharge.     Conjunctiva/sclera: Conjunctivae normal.  Cardiovascular:     Rate and Rhythm: Normal rate.  Pulmonary:     Effort: Pulmonary effort is normal.  Abdominal:     Palpations: There is no hepatomegaly or splenomegaly.  Musculoskeletal:        General: Normal range of motion.  Skin:    General: Skin is intact.     Coloration: Skin is not pale.     Findings: No rash.  Neurological:     Mental Status: She is alert and oriented to person, place, and time.  Psychiatric:        Mood and Affect: Mood and affect normal.        Behavior: Behavior normal.        Thought Content: Thought  content normal.        Judgment: Judgment normal.     Results for orders placed or performed in visit on 12/03/20  Pregnancy, urine  Result Value Ref Range   Preg Test, Ur Negative Negative      Assessment & Plan:   Problem List Items Addressed This Visit   None   Visit Diagnoses    Unprotected sex    -  Primary   Check STDs and pregnancy.  Pt is working with a Veterinary surgeon who is helping with some of the related stress   Relevant Orders   HIV Antibody (routine testing w rflx)   HSV(herpes simplex vrs) 1+2 ab-IgG   RPR   Chlamydia/GC NAA, Confirmation   Pregnancy, urine (Completed)       Follow up plan: Return if symptoms worsen or fail to improve.

## 2020-12-04 ENCOUNTER — Telehealth: Payer: Self-pay | Admitting: Nurse Practitioner

## 2020-12-04 LAB — HSV(HERPES SIMPLEX VRS) I + II AB-IGG
HSV 1 Glycoprotein G Ab, IgG: <0.91 index (ref 0.00–0.90)
HSV 2 IgG, Type Spec: 7.67 index — ABNORMAL HIGH (ref 0.00–0.90)

## 2020-12-04 LAB — HIV ANTIBODY (ROUTINE TESTING W REFLEX): HIV Screen 4th Generation wRfx: NONREACTIVE

## 2020-12-04 LAB — RPR: RPR Ser Ql: NONREACTIVE

## 2020-12-04 NOTE — Telephone Encounter (Signed)
Labs explained to patient. 

## 2020-12-04 NOTE — Telephone Encounter (Signed)
Patient called to ask the nurse or doctor to call her regarding some lab results.  She stated that she has some questions.  CB# [phone]

## 2020-12-07 ENCOUNTER — Other Ambulatory Visit: Payer: Self-pay | Admitting: Nurse Practitioner

## 2020-12-07 MED ORDER — DOXYCYCLINE HYCLATE 100 MG PO TABS: 100.0000 mg | ORAL_TABLET | Freq: Two times a day (BID) | ORAL | 0 refills | Status: AC

## 2020-12-07 NOTE — Progress Notes (Signed)
Please complete paper for health department + chlamydia, also call patient to ensure she received MyChart message.   Good evening Erica Long, this is Sibley Rolison.  I was checking on Erica Long's inbox and saw your results.  Chlamydia testing did return positive and gonorrhea negative.  At this time will send in Doxycycline for you to take twice a day for 7 days.  I recommend you notify partner of need for treatment too.  For this we will need to retest in 3 months, I recommend scheduling at visit for this and we can obtain HIV blood work testing then too for repeat check.  Any questions? Keep being awesome!!  Thank you for allowing me to participate in your care. Kindest regards, Shevy Yaney

## 2020-12-08 DIAGNOSIS — Z79899 Other long term (current) drug therapy: Secondary | ICD-10-CM | POA: Diagnosis not present

## 2020-12-08 DIAGNOSIS — F3181 Bipolar II disorder: Secondary | ICD-10-CM | POA: Diagnosis not present

## 2020-12-08 DIAGNOSIS — F419 Anxiety disorder, unspecified: Secondary | ICD-10-CM | POA: Diagnosis not present

## 2020-12-09 LAB — CHLAMYDIA/GC NAA, CONFIRMATION
Chlamydia trachomatis, NAA: POSITIVE — AB
Neisseria gonorrhoeae, NAA: NEGATIVE

## 2020-12-09 LAB — C. TRACHOMATIS NAA, CONFIRM: C. trachomatis NAA, Confirm: POSITIVE — AB

## 2020-12-24 ENCOUNTER — Ambulatory Visit: Payer: Medicaid Other | Admitting: Neurology

## 2021-01-05 DIAGNOSIS — F419 Anxiety disorder, unspecified: Secondary | ICD-10-CM | POA: Diagnosis not present

## 2021-01-05 DIAGNOSIS — F3181 Bipolar II disorder: Secondary | ICD-10-CM | POA: Diagnosis not present

## 2021-01-11 ENCOUNTER — Telehealth: Payer: Self-pay | Admitting: Family Medicine

## 2021-01-11 NOTE — Telephone Encounter (Signed)
Pt has r/s her 3-4 mo f/u. She is asking for a call to discuss seizure & tremors.  Pt also wants to know if there is anything her psychiatrist can do while she is waiting to be seen.

## 2021-01-11 NOTE — Telephone Encounter (Signed)
Spoke to pt. she related having an episode on the 25th and then again on 27 February.  Symptoms of uncontrollable shaking tremor staring off.  Questioning if anxiety stress related.  She maintains her medications Trileptal and Seroquel.  Has an appointment with Dr. Vickey Huger on 29 March will send message to [facility] for any cancellation on Dr. Vickey Huger schedule.  She may also touch base with psychiatry.  Will let AL/NP know and get back with her if anything more to offer. (relayed AL/NP out now , will get tomorrow).

## 2021-01-12 NOTE — Telephone Encounter (Signed)
We will get her in as soon as we can. Follow up with psychiatry. She should not drive if this is typical for seizure activity until being evaluated by Dr Vickey Huger.

## 2021-01-12 NOTE — Telephone Encounter (Signed)
I tried to call and LMVM for her if she could come in at 1300 today.  I did not save appt, if she calls and still available then can be placed there. I also sent mychart email as well.

## 2021-02-02 DIAGNOSIS — F3181 Bipolar II disorder: Secondary | ICD-10-CM | POA: Diagnosis not present

## 2021-02-09 ENCOUNTER — Encounter: Payer: Self-pay | Admitting: Neurology

## 2021-02-09 ENCOUNTER — Ambulatory Visit: Payer: Medicaid Other | Admitting: Neurology

## 2021-02-09 VITALS — BP 93/62 | HR 78 | Ht 65.0 in | Wt 117.0 lb

## 2021-02-09 DIAGNOSIS — R251 Tremor, unspecified: Secondary | ICD-10-CM | POA: Diagnosis not present

## 2021-02-09 DIAGNOSIS — F319 Bipolar disorder, unspecified: Secondary | ICD-10-CM

## 2021-02-09 DIAGNOSIS — F458 Other somatoform disorders: Secondary | ICD-10-CM | POA: Diagnosis not present

## 2021-02-09 NOTE — Progress Notes (Signed)
Provider:  Melvyn Novas, M D  Referring Provider: Marjie Skiff, NP Primary Care Physician:  Marjie Skiff, NP  Chief Complaint  Patient presents with  . Follow-up    Pt alone, rm 10. Pt presents to follow up on concerns. She states 2-3 wks ago she had 2 episodes back to back, one while she was driving. Her last episode was last wed and was the day after her psychiatrist started her on a new medication. When asked the pt to describe what happens she states, "tears hit all at once and begins to hyperventilate"      Revisit from 02-09-2021- Erica Long is a 20 y.o. female bipolar 1 patient with spells of pre-syncope and hyperventilation, lasting up to 45 minutes, having recently experienced an increase in the number of spells.  Patient had normal EEG and continues to present with spells, that could be many things, most likely anxiety-  40-45 minutes of shaking, trembling and complex behaviours with her eyes closed - that was a clue for non-epileptic activity.  In the last 30 days about 3 times, and not always sure about the trigger. She is calm and tense here- incessantly moving her legs, arms crossed in front of her chest, making eye contact.  Her body felt hot and sweaty - and I wondered first about a serotonin syndrome. She just started Buspar that morning- I would not expect a serotonin syndrome to come on right away.  Buspar is a serotonin receptor blocker- this is not interfering with Seroquel or Trileptal. Trileptal helps with mood, and so does Seroquel.  Good choice for anxiety management in her case, and can be increased to tid.  Hyperventilation is not a part of any seizure activity,it is a feature of panic and anxiety attacks.  She feels stuck when it happens, would like to remove herself from the company of others and go to a quiet room to let it pass, but can't manage this step. She denies any anger outbursts.     09/17/20 Erica Long is a 20 y.o.  female here today for follow up for history of presyncopal event in 06/2020. Workup has been unremarkable. EEG was normal. She continues to work with psychiatry. She is taking oxcarbazepine 300mg  BID and quetiapine 100mg  Long. She reports that she had an event in September after being off one of her medications for about 5 days. She is not sure which medication it was. She described being sensitive emotionally. She was banging on the wall. She remembers the event. She felt ants crawling on her. She was seen by psychiatry and medication refills were changes to makes sure she does not run out of meds. She feels that she is doing well. She is considering DBT. She is working full time. She states that employer has not allowed for lunch or meal breaks.    HPI:  Erica Long is a 20 y.o. female  Is seen here as an urgent  Referral from NP [facility] for near syncope.  Erica Long is a right-handed Caucasian female patient with a history of bipolar disorder, she receives primary care through [facility] but she has been seen since childhood.  She is followed by [facility] in [facility] and has been taking Trileptal 300 mg twice Long and Seroquel 100 mg at night to help her sleep.  On 6 August she experienced a near syncope spell this was not the first there  was a prior previous one on Thursday preceding that on Friday she had a total of 2 spells which made her called EMS.  With her first spell she had visited the ED but due to a very long wait turned away before she was seen.  The EMS evaluated her and found normal vital signs except for an elevated body temperature the body temperature supposedly was 104.5 F she had normal blood sugar normal oxygenation normal blood pressure and heart rate was regular at 81 bpm.  They suspected that the panic anxiety attack may have led to her spell the fever was not felt further evaluated and her EKG was with him which did not show  abnormalities either.  Following the EMS visit on 6 August [facility] the [facility] the same morning she had seen psychiatry.  She had been to the ER on 8 August without being seen.  The psychiatrist had her Seroquel in half so she only took 50 mg at night hoping that this would be helping was trembling and twitching.  Spells that are including twitching and trembling have lasted up to 3 hours.  The patient did not report any abnormal mood changes for many months preceding the spells.   The psychiatrist must have mentioned a concern about seizure disorder and recommended a neurology referral.    Primary care look for explanation for the fever but could not find an abnormality either,  spells are described as intense twitching and muscle spasms on Sunday the episode started with staring attack.  There was no shortness of breath no incontinence chest pain, headache loss of vision impairment of speech and no tongue bite.  Objective evaluation at her primary care showed a temperature of 98.5 Fahrenheit orally.  Lab test did not show any indication of an inflammatory disease, infection and her sodium potassium and chloride were all normal limits as were her kidney function and calcium levels.  White blood cell count was 5.3 so it is unlikely that she suffered an infection.  There was also no abnormal pattern of distribution between positive fields and granulocytes and lymphocytes.  Urine test was negative for infection.  Pregnancy test was negative.  Tricyclic medication which is prescribed for the patient checked out positive also cannabinoids cannabinoids.  Troponin was negative.  Her primary care also wrote for a thyroid scan and basic metabolic panel, and an RPR and HIV test.  There have been no spells since the Sunday.  During the emergency room visit on 6 August a CT of the head without contrast was normal.  Clinical evaluation did not reveal any one-sided weakness although the  patient stated that she felt her left was weak.  I also reviewed her medications she is currently on Zyrtec, Flonase, Trileptal, Seroquel, Depakote, hydroxyzine, Depo-Provera, Medrol Dosepak which has been tapered off, and labs in the emergency room were also negative for a urinary tract infection or elevated white blood cell count.  Review of Systems: Out of a complete 14 system review, the patient complains of only the following symptoms, and all other reviewed systems are negative.  twitching , trembling, spasms, restless, jittery.  Bipolar ,  Eating disorder.     Social History   Socioeconomic History  . Marital status: Single    Spouse name: Not on file  . Number of children: Not on file  . Years of education: Not on file  . Highest education level: Not on file  Occupational History  .  Not on file  Tobacco Use  . Smoking status: Current Every Day Smoker    Types: E-cigarettes  . Smokeless tobacco: Never Used  Vaping Use  . Vaping Use: Every day  . Substances: Nicotine  Substance and Sexual Activity  . Alcohol use: Not Currently  . Drug use: Yes    Types: Marijuana    Comment: Long use  . Sexual activity: Not Currently  Other Topics Concern  . Not on file  Social History Narrative  . Not on file   Social Determinants of Health   Financial Resource Strain: Not on file  Food Insecurity: Not on file  Transportation Needs: Not on file  Physical Activity: Not on file  Stress: Not on file  Social Connections: Not on file  Intimate Partner Violence: Not on file    Family History  Problem Relation Age of Onset  . Bipolar disorder Mother   . Depression Mother   . Heart attack Maternal Grandmother   . COPD Paternal Grandmother   . Depression Paternal Grandmother     Past Medical History:  Diagnosis Date  . Bipolar 1 disorder [facility])     Past Surgical History:  Procedure Laterality Date  . WISDOM TOOTH EXTRACTION      Current Outpatient Medications   Medication Sig Dispense Refill  . busPIRone (BUSPAR) 7.5 MG tablet Take 7.5 mg by mouth 2 (two) times Long.    . cetirizine (ZYRTEC) 10 MG tablet Take 10 mg by mouth Long as needed.    . fluticasone (FLONASE) 50 MCG/ACT nasal spray Place 1 spray into both nostrils Long.    . medroxyPROGESTERone (DEPO-PROVERA) 150 MG/ML injection Inject into the muscle.    . Oxcarbazepine (TRILEPTAL) 300 MG tablet Take 300 mg by mouth 2 (two) times Long.    . QUEtiapine (SEROQUEL) 100 MG tablet Take 200 mg by mouth at bedtime.     No current facility-administered medications for this visit.    Allergies as of 02/09/2021 - Review Complete 02/09/2021  Allergen Reaction Noted  . Lamotrigine Hives 12/03/2020    Vitals: BP 93/62   Pulse 78   Ht 5\' 5"  (1.651 m)   Wt 117 lb (53.1 kg)   BMI 19.47 kg/m  Last Weight:  Wt Readings from Last 1 Encounters:  02/09/21 117 lb (53.1 kg)   Last Height:   Ht Readings from Last 1 Encounters:  02/09/21 5\' 5"  (1.651 m)    Physical exam:  General: The patient is awake, alert and appears not in acute distress. The patient is well groomed. Head: Normocephalic, atraumatic. Neck is supple. Mallampati: 1, neck circumference:13.5" Cardiovascular:  Regular rate and rhythm , without  murmurs or carotid bruit, and without distended neck veins. Respiratory: Lungs are clear to auscultation. Skin:  Without evidence of edema, or rash- many monochromic tattooes.  Trunk: BMI is 19.27 kg/m2.   Neurologic exam : The patient is awake and alert, oriented to place and time.  Memory subjective  described as intact. There is a normal attention span & concentration ability. Speech is fluent without   dysarthria, dysphonia or aphasia. Mood and affect are appropriate.  Cranial nerves: Pupils are equal and briskly reactive to light. Funduscopic exam without evidence of pallor or edema. Extraocular movements  in vertical and horizontal planes intact and without nystagmus.  Visual  fields by finger perimetry are intact. Hearing to finger rub intact.  Facial sensation intact to fine touch. Facial motor strength is symmetric and tongue and uvula move midline.  Tongue protrusion into either cheek is normal. Shoulder shrug is normal.   Motor exam:   Normal tone ,muscle bulk and symmetric  strength in all extremities.  Sensory:  Fine touch, pinprick and vibration were tested in all extremities. Proprioception was normal.  Coordination: Rapid alternating movements in the fingers/hands were normal.  Finger-to-nose maneuver normal without evidence of ataxia, but dysmetria- noTremor.  This is functional, non organic.   Gait and station: Patient walks without assistive device and is unassisted-Appears robotic at first, but no astasia, abasia. Strength within normal limits.  Stance is stable and normal. Turns with 3 steps, and doesn't drift.  Tandem gait is unfragmented. Romberg testing is negative.   Deep tendon reflexes: in the upper and lower extremities are symmetric and intact. Babinski maneuver response was deferred.    Assessment:  45 minutes-  After physical and neurologic examination, review of laboratory studies, imaging, neurophysiology testing and pre-existing records, assessment is that of :  POSSIBLE DISSOCIATIVE DISORDER   Near syncope without orthostatic compenent, no preceding symptoms of panic or anxiety.    Shaking, trembling without identifiable trigger, about 45 minutes, no LOC-  Hyperventilation, eyes were closed. .  A period of  trembling is unlikely organic. EEG normal.    Plan:  Treatment plan and additional workup : Buspar is a serotonin receptor blocker- this is not interfering with Seroquel or Trileptal. Trileptal helps with mood, and so does Seroquel.  Good choice for anxiety management in her case, and can be increased to tid.    Non organic spells -by length, by reported eye close ure, tear flow, and repetitive motion without loss of  awareness.  I do not request an MRI .  My clinical suspecion is for a non-organic cause.  No follow up needed.   Porfirio Mylar Zamira Hickam MD 02/09/2021

## 2021-03-03 ENCOUNTER — Telehealth: Payer: Self-pay

## 2021-03-03 NOTE — Telephone Encounter (Signed)
Copied from CRM [id number]. Topic: General - Other >> Mar 03, 2021  3:42 PM Gwenlyn Fudge wrote: Reason for CRM: Pt calling and is requesting to know when she should come in for her next depo shot if her last one was on 02/09/21. Please advise.   Please advise.

## 2021-03-03 NOTE — Telephone Encounter (Signed)
Spoke with patient to notified her next depo injection was due between June 14th through the 28th. Patient verbalized understanding and has no further questions.

## 2021-03-10 ENCOUNTER — Ambulatory Visit: Payer: Self-pay | Admitting: Nurse Practitioner

## 2021-03-12 ENCOUNTER — Ambulatory Visit: Payer: Self-pay | Admitting: *Deleted

## 2021-03-12 NOTE — Telephone Encounter (Signed)
Called pt advised of Erica Long's message. Pt verbalized understanding  

## 2021-03-12 NOTE — Telephone Encounter (Signed)
Keep current appointment, but recommend if any worsening or bright red bleeding she immediately head to urgent care for assessment.

## 2021-03-12 NOTE — Telephone Encounter (Signed)
  Pt called stating that she has been on the depo shot x 3-4 years and has not spotted since her first year. She states however, that she has started spotting today and is concerned. Please advise.   Called patient to review concerns of vaginal bleeding / spotting while taking depro-provera shots. Patient will be due another depo shot in June per patient. Patient concerned of spotting due to she has not had any bleeding since receiving shots, x 3-4 years ago . Denies abdominal pain or bleeding other than spotting at this time. Patient concerned partner may have caused bleeding with inserting finger into vagina. Patient denies pain during insertion of partner's finger and would like to know if that could have caused spotting to occur. Patient reports she has started on new medication for her anxiety , possible buspar. Care advise given. Patient verbalized understanding of care advise and to call back or go to [facility] or ED if symptoms worsen. Patient has up coming appt in May.  Reason for Disposition . [1] Irregular vaginal bleeding or spotting AND [2] getting the birth control shot  Answer Assessment - Initial Assessment Questions 1. TYPE: "What type of birth control shot are you getting?"  (e.g., Depo-Provera or Depo-subQ Provera 104 shot given every 3 months)     depro-provera 2. START DATE: "When did you first start getting the birth control shot? ? (e.g., date; weeks, months, years ago)  When was the last shot?"     3-4 years ago  3. SYMPTOM: "What is the main symptom (or question) you're concerned about?"     started spotting today  4. LOCATION: "Where is na located?" (e.g., abdomen, inside/outside, right/left)     Denies pain 5. ONSET: "When did the bleeding start?"     Today  6. VAGINAL BLEEDING: "Are you having any unusual vaginal bleeding?"     - NONE     - SPOTTING: spotting or pinkish / brownish mucous discharge; does not fill panty-liner or pad     - MILD: less than 1 pad / hour; less than  patient's usual menstrual bleeding     - MODERATE: 1-2 pads / hour; small-medium blood clots (e.g., pea, grape, small coin)     - SEVERE: soaking 2 or more pads/hour for 2 or more hours; bleeding not contained by pads or tampons     spotting 7. ABDOMEN OR PELVIC PAIN: "Are you have any pain in your abdomen or pelvic area?" (Scale: 0, 1-10; none, mild, moderate, severe)   - NONE (0): no pain   - MILD (1-3): doesn't interfere with normal activities, abdomen soft and not tender to touch    - MODERATE (4-7): interferes with normal activities or awakens from sleep, tender to touch    - SEVERE (8-10): excruciating pain, doubled over, unable to do any normal activities      none 8. PREGNANCY: "Are you concerned you might be pregnant?" "When was your last menstrual period?"     No  Protocols used: CONTRACEPTION - BIRTH CONTROL SHOT SYMPTOMS AND QUESTIONS-A-AH

## 2021-03-25 ENCOUNTER — Ambulatory Visit (INDEPENDENT_AMBULATORY_CARE_PROVIDER_SITE_OTHER): Payer: Medicaid Other | Admitting: Nurse Practitioner

## 2021-03-25 ENCOUNTER — Encounter: Payer: Self-pay | Admitting: Nurse Practitioner

## 2021-03-25 ENCOUNTER — Other Ambulatory Visit: Payer: Self-pay

## 2021-03-25 VITALS — BP 94/64 | HR 85 | Temp 98.4°F | Wt 117.8 lb

## 2021-03-25 DIAGNOSIS — A749 Chlamydial infection, unspecified: Secondary | ICD-10-CM | POA: Diagnosis not present

## 2021-03-25 NOTE — Progress Notes (Signed)
BP 94/64   Pulse 85   Temp 98.4 F (36.9 C) (Oral)   Wt 117 lb 12.8 oz (53.4 kg)   SpO2 99%   BMI 19.60 kg/m    Subjective:    Patient ID: Erica Long, female    DOB: 2001/11/02, 19 y.o.   MRN: [id number]  HPI: Erica Long is a 20 y.o. female  Chief Complaint  Patient presents with  . STD Follow Up    Patient denies having any problems or concerns at today's visit.   STD SCREENING Tested positive for chlamydia and negative gonorrhea 12/03/20 -- treated with Doxycycline, she completed this full course. Sexual activity: not at moment Contraception: yes Recent unprotected intercourse: no History of sexually transmitted diseases: yes Previous sexually transmitted disease screening: no Lifetime sexual partners: 5 Genital lesions: no Dysuria: no Swollen lymph nodes: no Fevers: no Rash: no  Relevant past medical, surgical, family and social history reviewed and updated as indicated. Interim medical history since our last visit reviewed. Allergies and medications reviewed and updated.  Review of Systems  Constitutional: Negative for activity change, appetite change, diaphoresis, fatigue and fever.  Respiratory: Negative for cough, chest tightness and shortness of breath.   Cardiovascular: Negative for chest pain, palpitations and leg swelling.  Gastrointestinal: Negative.   Genitourinary: Negative.   Neurological: Negative.   Psychiatric/Behavioral: Negative.     Per HPI unless specifically indicated above     Objective:    BP 94/64   Pulse 85   Temp 98.4 F (36.9 C) (Oral)   Wt 117 lb 12.8 oz (53.4 kg)   SpO2 99%   BMI 19.60 kg/m   Wt Readings from Last 3 Encounters:  03/25/21 117 lb 12.8 oz (53.4 kg)  02/09/21 117 lb (53.1 kg)  12/03/20 116 lb (52.6 kg)    Physical Exam Vitals and nursing note reviewed.  Constitutional:      General: She is awake. She is not in acute distress.    Appearance: She is well-developed and well-groomed. She is not  ill-appearing.  HENT:     Head: Normocephalic.     Right Ear: Hearing normal.     Left Ear: Hearing normal.  Eyes:     General: Lids are normal.        Right eye: No discharge.        Left eye: No discharge.     Pupils: Pupils are equal, round, and reactive to light.  Neck:     Thyroid: No thyromegaly.     Vascular: No carotid bruit.  Cardiovascular:     Rate and Rhythm: Normal rate and regular rhythm.     Heart sounds: Normal heart sounds. No murmur heard. No gallop.   Pulmonary:     Effort: Pulmonary effort is normal. No accessory muscle usage or respiratory distress.     Breath sounds: Normal breath sounds.  Abdominal:     General: Bowel sounds are normal.     Palpations: Abdomen is soft.  Musculoskeletal:     Cervical back: Normal range of motion and neck supple.     Right lower leg: No edema.     Left lower leg: No edema.  Skin:    General: Skin is warm and dry.  Neurological:     Mental Status: She is alert and oriented to person, place, and time.  Psychiatric:        Attention and Perception: Attention normal.        Mood and Affect:  Mood normal.        Speech: Speech normal.        Behavior: Behavior normal. Behavior is cooperative.        Thought Content: Thought content normal.     Results for orders placed or performed in visit on 12/03/20  Chlamydia/GC NAA, Confirmation   Specimen: Urine   UR  Result Value Ref Range   Chlamydia trachomatis, NAA Positive (A) Negative   Neisseria gonorrhoeae, NAA Negative Negative  C. trachomatis NAA, Confirm   UR  Result Value Ref Range   C. trachomatis NAA, Confirm Positive (A) Negative  HIV Antibody (routine testing w rflx)  Result Value Ref Range   HIV Screen 4th Generation wRfx Non Reactive Non Reactive  HSV(herpes simplex vrs) 1+2 ab-IgG  Result Value Ref Range   HSV 1 Glycoprotein G Ab, IgG <0.91 0.00 - 0.90 index   HSV 2 IgG, Type Spec 7.67 (H) 0.00 - 0.90 index  RPR  Result Value Ref Range   RPR Ser Ql  Non Reactive Non Reactive  Pregnancy, urine  Result Value Ref Range   Preg Test, Ur Negative Negative      Assessment & Plan:   Problem List Items Addressed This Visit      Other   Chlamydia infection - Primary    Diagnosed and treated 12/03/20 -- will repeat testing today and treat as needed.      Relevant Orders   GC/Chlamydia Probe Amp       Follow up plan: Return if symptoms worsen or fail to improve.

## 2021-03-25 NOTE — Assessment & Plan Note (Signed)
Diagnosed and treated 12/03/20 -- will repeat testing today and treat as needed.

## 2021-03-25 NOTE — Patient Instructions (Signed)
Chlamydia, Female Chlamydia is an STI (sexually transmitted infection). This infection spreads through sexual contact. This condition is not hard to treat. But if it is not treated, it can cause worse health problems. You may have a higher risk of not being able to have children. Also, if you have untreated chlamydia and you are pregnant or get pregnant:  You can spread the infection to your baby during delivery.  Your baby may have health problems if he or she gets infected. What are the causes? This condition is caused by germs (bacteria) called Chlamydia trachomatis. These germs are spread from an infected partner during sex. The infection can spread through contact with the genitals, mouth, or butt. The infection can grow in:  The urethra. This is the part of the body that drains pee (urine) from the bladder.  The cervix. This is the lowest part of the womb, or uterus.  The throat.  The butt (rectum).   What increases the risk? The following factors may make you more likely to develop this condition:  Not using a condom the right way.  Not using a condom each time you have sex.  Having a new sex partner.  Having more than one sex partner.  Being female, age 14-25, and sexually active. What are the signs or symptoms? In some cases, there are no symptoms (you are asymptomatic), often early in the illness. If you get symptoms, they may include:  Peeing often, or a burning feeling when you pee.  Fluid (discharge) coming from the vagina.  Blood or fluid coming from the butt.  Redness, soreness, or swelling of the butt.  Belly pain or pain during sex.  Bleeding between monthly periods or irregular periods.  Itching, burning, or redness of the eyes.  Fluid coming from the eyes. How is this treated? This condition is treated with antibiotic medicines. If you are pregnant, you will not get some types of antibiotics. Follow these instructions at home: Sexual  activity  Tell your sex partner or partners about your infection. Sex partners are people you had oral, anal, or vaginal sex with within 60 days of when you started getting sick. They need treatment even if they do not feel or seem sick.  Do not have sex until: ? You and your sex partners have been treated. ? Your doctor says it is okay.  If you get just one dose of medicine, wait at least 7 days before having sex. General instructions  Take over-the-counter and prescription medicines as told by your doctor. Finish all antibiotic medicine even when you start to feel better.  It is up to you to get your test results. Ask your doctor, or the department that is doing the test, when your results will be ready.  Get a lot of rest.  Eat healthy foods.  Drink enough fluid to keep your pee pale yellow.  Keep all follow-up visits as told by your doctor. This is important. You may need tests after 3 months. How is this prevented? The only way to keep from getting infected is to not have vaginal, anal, or oral sex. To lower your risk:  Use latex condoms the right way. Do this every time you have sex.  Do not have many sex partners.  Ask if your sex partner got tested for STIs and had negative results.  Get regular health screenings to check for STIs.   Contact a doctor if:  You get new symptoms.  You do not get better   with treatment.  You have a fever or chills.  Sex is painful.  You get new joint pain or swelling near your joints.  Your periods are irregular.  You bleed between periods or after sex.  You get flu-like symptoms. These may be: ? Night sweats. ? Sore throat. ? Muscle aches.  You are pregnant and you get symptoms of chlamydia. Get help right away if:  Your pain gets worse and does not get better with medicine.  You have belly pain that does not get better with medicine.  You have lower back pain that does not get better with medicine.  You feel weak or  dizzy.  You faint. Summary  Chlamydia is an infection that spreads through sexual contact.  This condition is not hard to treat. But if it is not treated, it can cause worse health problems.  Sometimes, there are no symptoms of this condition, often early in the illness.  This condition is treated with antibiotic medicines.  Use latex condoms the right way. Do this every time you have sex. This information is not intended to replace advice given to you by your health care provider. Make sure you discuss any questions you have with your health care provider. Document Revised: 10/28/2019 Document Reviewed: 10/28/2019 Elsevier Patient Education  2021 Elsevier Inc.  

## 2021-03-28 LAB — GC/CHLAMYDIA PROBE AMP
Chlamydia trachomatis, NAA: NEGATIVE
Neisseria Gonorrhoeae by PCR: NEGATIVE

## 2021-03-29 NOTE — Progress Notes (Signed)
Contacted via MyChart   Good morning Erica Long, your testing returned negative this check.  Meaning treatment worked and no further needed.  Great news!! Keep being awesome!!  Thank you for allowing me to participate in your care.  I appreciate you. Kindest regards, Lendon George

## 2021-05-06 DIAGNOSIS — F3181 Bipolar II disorder: Secondary | ICD-10-CM | POA: Diagnosis not present

## 2021-05-06 DIAGNOSIS — F410 Panic disorder [episodic paroxysmal anxiety] without agoraphobia: Secondary | ICD-10-CM | POA: Diagnosis not present

## 2021-05-11 ENCOUNTER — Other Ambulatory Visit: Payer: Self-pay

## 2021-05-11 ENCOUNTER — Encounter: Payer: Self-pay | Admitting: Nurse Practitioner

## 2021-05-11 ENCOUNTER — Ambulatory Visit (INDEPENDENT_AMBULATORY_CARE_PROVIDER_SITE_OTHER): Payer: Medicaid Other | Admitting: Nurse Practitioner

## 2021-05-11 VITALS — BP 104/69 | HR 83 | Temp 98.9°F | Wt 120.8 lb

## 2021-05-11 DIAGNOSIS — A6004 Herpesviral vulvovaginitis: Secondary | ICD-10-CM

## 2021-05-11 DIAGNOSIS — Z113 Encounter for screening for infections with a predominantly sexual mode of transmission: Secondary | ICD-10-CM | POA: Diagnosis not present

## 2021-05-11 DIAGNOSIS — A6000 Herpesviral infection of urogenital system, unspecified: Secondary | ICD-10-CM | POA: Insufficient documentation

## 2021-05-11 MED ORDER — VALACYCLOVIR HCL 1 G PO TABS: 1000.0000 mg | ORAL_TABLET | Freq: Two times a day (BID) | ORAL | 0 refills | Status: AC

## 2021-05-11 MED ORDER — MUPIROCIN 2 % EX OINT: 1.0000 "application " | TOPICAL_OINTMENT | Freq: Two times a day (BID) | CUTANEOUS | 0 refills | Status: DC

## 2021-05-11 NOTE — Assessment & Plan Note (Signed)
Acute since Saturday -- ?herpes outbreak vs folliculitis as current lesions currently with crusting and closer to hair line.  At this time will send in Valtrex 1000 MG BID x 10 days and Mupirocin to apply over area as needed.  Recommend safe sexual intercourse.  She requests STD re screen, will check today.

## 2021-05-11 NOTE — Progress Notes (Signed)
BP 104/69   Pulse 83   Temp 98.9 F (37.2 C) (Oral)   Wt 120 lb 12.8 oz (54.8 kg)   SpO2 98%   BMI 20.10 kg/m    Subjective:    Patient ID: Erica Long, female    DOB: [birth year], 20 y.o.   MRN: [id number]  HPI: Erica Long is a 20 y.o. female  Chief Complaint  Patient presents with   HSV    Patient states Saturday she noticed the breakout denies having any irritation.    SKIN LESION Noted on Saturday to genital, non painful lesions.  Noticed 3 small areas.  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  Initially looked like blisters but she thinks they popped.  She would also like STD screening again at this visit. Duration: days Location: genital area Painful: no Itching: no Onset: sudden Context: not changing Associated signs and symptoms: none History of skin cancer: no History of precancerous skin lesions: no Family history of skin cancer: no   Relevant past medical, surgical, family and social history reviewed and updated as indicated. Interim medical history since our last visit reviewed. Allergies and medications reviewed and updated.  Review of Systems  Constitutional:  Negative for activity change, appetite change, diaphoresis, fatigue and fever.  Respiratory:  Negative for cough, chest tightness and shortness of breath.   Cardiovascular:  Negative for chest pain, palpitations and leg swelling.  Gastrointestinal: Negative.   Skin:  Positive for rash.  Neurological: Negative.   Psychiatric/Behavioral: Negative.     Per HPI unless specifically indicated above     Objective:    BP 104/69   Pulse 83   Temp 98.9 F (37.2 C) (Oral)   Wt 120 lb 12.8 oz (54.8 kg)   SpO2 98%   BMI 20.10 kg/m   Wt Readings from Last 3 Encounters:  05/11/21 120 lb 12.8 oz (54.8 kg)  03/25/21 117 lb 12.8 oz (53.4 kg)  02/09/21 117 lb (53.1 kg)    Physical Exam Vitals and nursing note reviewed. Exam conducted with a chaperone present.  Constitutional:       General: She is awake. She is not in acute distress.    Appearance: She is well-developed and well-groomed. She is not ill-appearing or toxic-appearing.  HENT:     Head: Normocephalic.     Right Ear: Hearing normal.     Left Ear: Hearing normal.  Eyes:     General: Lids are normal.        Right eye: No discharge.        Left eye: No discharge.     Conjunctiva/sclera: Conjunctivae normal.     Pupils: Pupils are equal, round, and reactive to light.  Neck:     Thyroid: No thyromegaly.     Vascular: No carotid bruit.  Cardiovascular:     Rate and Rhythm: Normal rate and regular rhythm.     Heart sounds: Normal heart sounds. No murmur heard.   No gallop.  Pulmonary:     Effort: Pulmonary effort is normal. No accessory muscle usage or respiratory distress.     Breath sounds: Normal breath sounds.  Abdominal:     General: Bowel sounds are normal.     Palpations: Abdomen is soft. There is no hepatomegaly or splenomegaly.     Hernia: There is no hernia in the left inguinal area or right inguinal area.  Genitourinary:    Labia:  Right: No rash.        Left: No rash.     Musculoskeletal:     Cervical back: Normal range of motion and neck supple.     Right lower leg: No edema.     Left lower leg: No edema.  Skin:    General: Skin is warm and dry.  Neurological:     Mental Status: She is alert and oriented to person, place, and time.  Psychiatric:        Attention and Perception: Attention normal.        Mood and Affect: Mood normal.        Speech: Speech normal.        Behavior: Behavior normal. Behavior is cooperative.        Thought Content: Thought content normal.    Results for orders placed or performed in visit on 03/25/21  GC/Chlamydia Probe Amp   Specimen: Urine   UR  Result Value Ref Range   Chlamydia trachomatis, NAA Negative Negative   Neisseria Gonorrhoeae by PCR Negative Negative      Assessment & Plan:   Problem List Items Addressed This Visit        Genitourinary   Genital herpes - Primary    Acute since Saturday -- ?herpes outbreak vs folliculitis as current lesions currently with crusting and closer to hair line.  At this time will send in Valtrex 1000 MG BID x 10 days and Mupirocin to apply over area as needed.  Recommend safe sexual intercourse.  She requests STD re screen, will check today.       Relevant Medications   valACYclovir (VALTREX) 1000 MG tablet   mupirocin ointment (BACTROBAN) 2 %   Other Visit Diagnoses     Screen for STD (sexually transmitted disease)       STD screening today to include HSV, Gon/chlam, RPR, HIV per patient request.   Relevant Orders   GC/Chlamydia Probe Amp   RPR   HSV(herpes simplex vrs) 1+2 ab-IgG   HIV Antibody (routine testing w rflx)        Follow up plan: Return if symptoms worsen or fail to improve.

## 2021-05-11 NOTE — Patient Instructions (Signed)
[email or web address]">  Genital Herpes Genital herpes is a common sexually transmitted infection (STI) that is caused by a virus. The virus spreads from person to person through sexual contact. The infection can cause itching, blisters, and sores around the genitals or rectum. Symptoms may last for several days and then go away. This is called an outbreak. The virus remains in the body, however, so more outbreaks may happen in the future. The time between outbreaks varies and can be from months toyears. Genital herpes can affect anyone. It is particularly concerning for pregnant women because the virus can be passed to the baby during delivery and can cause serious problems. Genital herpes is also a concern for people who have a weak disease-fighting system (immune system). What are the causes? This condition is caused by the human herpesvirus, also called herpes simplex virus, type 1 or type 2 (HSV-1 or HSV-2). The virus may spread through: Sexual contact with an infected person, including vaginal, anal, and oral sex. Contact with fluid from a herpes sore. The skin. This means that you can get herpes from an infected partner even if there are no blisters or sores present. Your partner may not know that he or she is infected. What increases the risk? You are more likely to develop this condition if: You have sex with many partners. You do not use latex condoms during sex. What are the signs or symptoms? Most people do not have symptoms (are asymptomatic), or they have mild symptoms that may be mistaken for other skin problems. Symptoms may include: Small, red bumps near the genitals, rectum, or mouth. These bumps turn into blisters and then sores. Flu-like symptoms, including: Fever. Body aches. Swollen lymph nodes. Headache. Painful urination. Pain and itching in the genital area or rectal area. Vaginal discharge. Tingling  or shooting pain in the legs and buttocks. Generally, symptoms are more severe and last longer during the first (primary) outbreak. Flu-like symptoms are also more common during the primary outbreak. How is this diagnosed? This condition may be diagnosed based on: A physical exam. Your medical history. Blood tests. A test of a fluid sample (culture) from an open sore. How is this treated? There is no cure for this condition, but treatment with antiviral medicines that are taken by mouth (orally) can do the following: Speed up healing and relieve symptoms. Help to reduce the spread of the virus to sexual partners. Limit the chance of future outbreaks, or make future outbreaks shorter. Lessen symptoms of future outbreaks. Your health care provider may also recommend pain relief medicines, such asaspirin or ibuprofen. Follow these instructions at home: If you have an outbreak: Keep the affected areas dry and clean. Avoid rubbing or touching blisters and sores. If you do touch blisters or sores: Wash your hands thoroughly with soap and water. Do not touch your eyes afterward. To help relieve pain or itching, you may take the following actions as told by your health care provider: Apply a cold, wet cloth (cold compress) to affected areas 4-6 times a day. Apply a substance that protects your skin and reduces bleeding (astringent). Apply a gel that helps relieve pain around sores (lidocaine gel). Take a warm, shallow bath that cleans the genital area (sitz bath). Sexual activity Do not have sexual contact during active outbreaks. Practice safe sex. Herpes can spread even if your partner does not have blisters or sores. Latex condoms and female condoms may help prevent the spread of the herpes virus. General instructions Take over-the-counter and   prescription medicines only as told by your health care provider. Keep all follow-up visits as told by your health care provider. This is  important. How is this prevented? Use condoms. Although you can get genital herpes during sexual contact even with the use of a condom, a condom can provide some protection. Avoid having multiple sexual partners. Talk with your sexual partner about any symptoms either of you may have. Also, talk with your partner about any history of STIs. Get tested for STIs before you have sex. Ask your partner to do the same. Do not have sexual contact if you have active symptoms of genital herpes. Contact a health care provider if: Your symptoms are not improving with medicine. Your symptoms return, or you have new symptoms. You have a fever. You have abdominal pain. You have redness, swelling, or pain in your eye. You notice new sores on other parts of your body. You are a woman and you experience bleeding between menstrual periods. You have had herpes and you become pregnant or plan to become pregnant. Summary Genital herpes is a common sexually transmitted infection (STI) that is caused by the herpes simplex virus, type 1 or type 2 (HSV-1 or HSV-2). These viruses are most often spread through sexual contact with an infected person. You are more likely to develop this condition if you have sex with many partners or you do not use condoms during sex. Most people do not have symptoms (are asymptomatic) or have mild symptoms that may be mistaken for other skin problems. Symptoms occur as outbreaks that may happen months or years apart. There is no cure for this condition, but treatment with oral antiviral medicines can reduce symptoms, reduce the chance of spreading the virus to a partner, prevent future outbreaks, or shorten future outbreaks. This information is not intended to replace advice given to you by your health care provider. Make sure you discuss any questions you have with your healthcare provider. Document Revised: 07/11/2019 Document Reviewed: 07/11/2019 Elsevier Patient Education  2022  Elsevier Inc.  

## 2021-05-12 LAB — HSV(HERPES SIMPLEX VRS) I + II AB-IGG
HSV 1 Glycoprotein G Ab, IgG: <0.91 index (ref 0.00–0.90)
HSV 2 IgG, Type Spec: 8.14 index — ABNORMAL HIGH (ref 0.00–0.90)

## 2021-05-12 LAB — SYPHILIS: RPR W/REFLEX TO RPR TITER AND TREPONEMAL ANTIBODIES, TRADITIONAL SCREENING AND DIAGNOSIS ALGORITHM: RPR Ser Ql: NONREACTIVE

## 2021-05-12 LAB — HIV ANTIBODY (ROUTINE TESTING W REFLEX): HIV Screen 4th Generation wRfx: NONREACTIVE

## 2021-05-12 NOTE — Progress Notes (Signed)
Contacted via MyChart   Good morning Erica Long, we are just waiting on urine test, but overall STD testing has returned negative with exception of ongoing elevation in genital herpes lab, HSV2.  Any questions?  How are you doing today? Keep being awesome!!  Thank you for allowing me to participate in your care.  I appreciate you. Kindest regards, Adrina Armijo

## 2021-05-13 LAB — GC/CHLAMYDIA PROBE AMP
Chlamydia trachomatis, NAA: NEGATIVE
Neisseria Gonorrhoeae by PCR: NEGATIVE

## 2021-05-13 NOTE — Progress Notes (Signed)
Contacted via MyChart   Good afternoon Erica Long, urine testing is negative:)

## 2021-05-19 ENCOUNTER — Telehealth: Payer: Self-pay

## 2021-05-19 NOTE — Telephone Encounter (Signed)
Pt is wanting to know if she is late for depo not showing record of recent one, tiffany looked as well. Pt states that she believes that she had one in Jan but not sure about may. Please advise on if pt needs a full appt

## 2021-05-19 NOTE — Telephone Encounter (Signed)
Scheduled with lauren tomorrow

## 2021-05-20 ENCOUNTER — Ambulatory Visit (INDEPENDENT_AMBULATORY_CARE_PROVIDER_SITE_OTHER): Payer: Medicaid Other | Admitting: Nurse Practitioner

## 2021-05-20 ENCOUNTER — Encounter: Payer: Self-pay | Admitting: Nurse Practitioner

## 2021-05-20 ENCOUNTER — Other Ambulatory Visit: Payer: Self-pay

## 2021-05-20 VITALS — BP 104/72 | HR 83 | Temp 98.2°F | Wt 118.6 lb

## 2021-05-20 DIAGNOSIS — Z3042 Encounter for surveillance of injectable contraceptive: Secondary | ICD-10-CM

## 2021-05-20 DIAGNOSIS — Z3009 Encounter for other general counseling and advice on contraception: Secondary | ICD-10-CM

## 2021-05-20 LAB — PREGNANCY, URINE: Preg Test, Ur: NEGATIVE

## 2021-05-20 MED ORDER — MEDROXYPROGESTERONE ACETATE 150 MG/ML IM SUSP
150.0000 mg | Freq: Once | INTRAMUSCULAR | Status: AC
  Administered 2021-05-20: 150 mg via INTRAMUSCULAR

## 2021-05-20 NOTE — Progress Notes (Signed)
Established Patient Office Visit  Subjective:  Patient ID: Erica Long, female    DOB: [birth year]  Age: 20 y.o. MRN: [id number]  CC:  Chief Complaint  Patient presents with   Contraception    Patient is she is here for Depo injection.     HPI Erica Long presents for depo provera injection. She is about 3 weeks late for next injection. She had a period for several days which was really heavy the first day, bleeding through a tampon in less than 3 hours. The next few days were not as heavy and it stopped last night. She denies dizziness, lightheadedness, fatigue, and weakness.   Past Medical History:  Diagnosis Date   Bipolar 1 disorder (HCC)    HSV (herpes simplex virus) infection     Past Surgical History:  Procedure Laterality Date   WISDOM TOOTH EXTRACTION      Family History  Problem Relation Age of Onset   Bipolar disorder Mother    Depression Mother    Heart attack Maternal Grandmother    COPD Paternal Grandmother    Depression Paternal Grandmother     Social History   Socioeconomic History   Marital status: Single    Spouse name: Not on file   Number of children: Not on file   Years of education: Not on file   Highest education level: Not on file  Occupational History   Not on file  Tobacco Use   Smoking status: Every Day    Pack years: 0.00    Types: E-cigarettes   Smokeless tobacco: Never  Vaping Use   Vaping Use: Every day   Substances: Nicotine  Substance and Sexual Activity   Alcohol use: Not Currently   Drug use: Yes    Types: Marijuana    Comment: daily use   Sexual activity: Not Currently  Other Topics Concern   Not on file  Social History Narrative   Not on file   Social Determinants of Health   Financial Resource Strain: Not on file  Food Insecurity: Not on file  Transportation Needs: Not on file  Physical Activity: Not on file  Stress: Not on file  Social Connections: Not on file  Intimate Partner Violence: Not on file     Outpatient Medications Prior to Visit  Medication Sig Dispense Refill   busPIRone (BUSPAR) 7.5 MG tablet Take 7.5 mg by mouth 2 (two) times daily.     cetirizine (ZYRTEC) 10 MG tablet Take 10 mg by mouth daily as needed.     fluticasone (FLONASE) 50 MCG/ACT nasal spray Place 1 spray into both nostrils daily.     medroxyPROGESTERone (DEPO-PROVERA) 150 MG/ML injection Inject into the muscle.     mupirocin ointment (BACTROBAN) 2 % Apply 1 application topically 2 (two) times daily. 22 g 0   Oxcarbazepine (TRILEPTAL) 300 MG tablet Take 300 mg by mouth 2 (two) times daily.     QUEtiapine (SEROQUEL) 100 MG tablet Take 200 mg by mouth at bedtime.     valACYclovir (VALTREX) 1000 MG tablet Take 1 tablet (1,000 mg total) by mouth 2 (two) times daily for 10 days. 20 tablet 0   No facility-administered medications prior to visit.    Allergies  Allergen Reactions   Lamotrigine Hives    ROS Review of Systems  Constitutional: Negative.   Respiratory: Negative.    Cardiovascular: Negative.   Gastrointestinal: Negative.   Genitourinary: Negative.   Neurological: Negative.      Objective:  Physical Exam Vitals and nursing note reviewed.  Constitutional:      General: She is not in acute distress.    Appearance: Normal appearance.  HENT:     Head: Normocephalic and atraumatic.  Eyes:     Conjunctiva/sclera: Conjunctivae normal.  Cardiovascular:     Rate and Rhythm: Normal rate and regular rhythm.     Pulses: Normal pulses.     Heart sounds: Normal heart sounds.  Pulmonary:     Effort: Pulmonary effort is normal.     Breath sounds: Normal breath sounds.  Musculoskeletal:     Cervical back: Normal range of motion.  Skin:    General: Skin is warm and dry.  Neurological:     General: No focal deficit present.     Mental Status: She is alert and oriented to person, place, and time.  Psychiatric:        Mood and Affect: Mood normal.        Behavior: Behavior normal.         Thought Content: Thought content normal.        Judgment: Judgment normal.    BP 104/72   Pulse 83   Temp 98.2 F (36.8 C) (Oral)   Wt 118 lb 9.6 oz (53.8 kg)   SpO2 100%   BMI 19.74 kg/m  Wt Readings from Last 3 Encounters:  05/20/21 118 lb 9.6 oz (53.8 kg)  05/11/21 120 lb 12.8 oz (54.8 kg)  03/25/21 117 lb 12.8 oz (53.4 kg)     Health Maintenance Due  Topic Date Due   PNEUMOCOCCAL POLYSACCHARIDE VACCINE AGE 9-64 HIGH RISK  Never done   Pneumococcal Vaccine 57-59 Years old (1 - PCV) Never done   HPV VACCINES (1 - 2-dose series) Never done   COVID-19 Vaccine (3 - Booster for Pfizer series) 08/24/2020       Topic Date Due   HPV VACCINES (1 - 2-dose series) Never done    Lab Results  Component Value Date   TSH 2.010 06/23/2020   Lab Results  Component Value Date   WBC 4.6 06/23/2020   HGB 13.1 06/23/2020   HCT 39.6 06/23/2020   MCV 89 06/23/2020   PLT 248 06/23/2020   Lab Results  Component Value Date   NA 138 06/23/2020   K 4.4 06/23/2020   CO2 21 06/23/2020   GLUCOSE 80 06/23/2020   BUN 10 06/23/2020   CREATININE 0.70 06/23/2020   BILITOT 0.6 09/24/2018   ALKPHOS 71 09/24/2018   AST 13 09/24/2018   ALT 7 09/24/2018   PROT 8.0 09/24/2018   ALBUMIN 5.1 09/24/2018   CALCIUM 9.0 06/23/2020   ANIONGAP 10 06/21/2020   No results found for: CHOL No results found for: HDL No results found for: LDLCALC No results found for: TRIG No results found for: CHOLHDL No results found for: GLOV5I    Assessment & Plan:   Problem List Items Addressed This Visit       Other   Birth control counseling - Primary    Pregnancy test today negative. Depo-provera given today. Next dose due in 3 months. Discussed importance of scheduling and receiving vaccine in the timeframe.        Relevant Orders   Pregnancy, urine   Other Visit Diagnoses     Encounter for Depo-Provera contraception       Urine pregnancy negative today. Will give depo-provera IM today.  Schedule nurse visit in 3 months for next dose   Relevant Medications  medroxyPROGESTERone (DEPO-PROVERA) injection 150 mg (Completed)       Meds ordered this encounter  Medications   medroxyPROGESTERone (DEPO-PROVERA) injection 150 mg    Follow-up: Return in about 3 months (around 08/20/2021) for nurse visit for depo injection.    Gerre Scull, NP

## 2021-05-20 NOTE — Telephone Encounter (Signed)
noted 

## 2021-05-20 NOTE — Assessment & Plan Note (Addendum)
Pregnancy test today negative. Depo-provera given today. Next dose due in 3 months. Discussed importance of scheduling and receiving vaccine in the timeframe.

## 2021-06-29 ENCOUNTER — Encounter: Payer: Self-pay | Admitting: Nurse Practitioner

## 2021-06-29 ENCOUNTER — Ambulatory Visit: Payer: Medicaid Other | Admitting: Nurse Practitioner

## 2021-06-29 ENCOUNTER — Other Ambulatory Visit: Payer: Self-pay

## 2021-06-29 VITALS — BP 109/72 | HR 71 | Temp 98.2°F | Wt 118.0 lb

## 2021-06-29 DIAGNOSIS — F319 Bipolar disorder, unspecified: Secondary | ICD-10-CM | POA: Diagnosis not present

## 2021-06-29 DIAGNOSIS — Z3009 Encounter for other general counseling and advice on contraception: Secondary | ICD-10-CM | POA: Diagnosis not present

## 2021-06-29 NOTE — Assessment & Plan Note (Signed)
Discussed various options with patient -- she wishes to ponder these and will MyChart provider when decision made.

## 2021-06-29 NOTE — Progress Notes (Signed)
BP 109/72   Pulse 71   Temp 98.2 F (36.8 C) (Oral)   Wt 118 lb (53.5 kg)   SpO2 100%   BMI 19.64 kg/m    Subjective:    Patient ID: Erica Long, female    DOB: 2001-10-01, 20 y.o.   MRN: [id number]  HPI: Erica Long is a 20 y.o. female  Chief Complaint  Patient presents with   Contraception    Patient states she is here to discuss possibly switching birth control options. Patient state she has been on it for 3-3.5 years and she states she has been having issues with irregular bleeding.    BIRTH CONTROL COUNSELING: Would like to discuss changing birth control options, is having issues with irregular bleeding at this time.  Currently is sexually active.  When she started bled for 6 months straight, then no bleeding for one year.  Recently had some heavier bleeding, that then went away for 1-2 months.  Heavier bleeding last 4-5 days.  Has never tried other birth control options, other then Depo.    ANXIETY/STRESS Is being followed by [facility] in [place], last saw yesterday. Is taking Trileptal 300 MG BID + Buspar, and Seroquel 200 MG QHS for Bipolar Disorder. She reports this works well for her.  Sees them next on Thursday. Duration:stable Anxious mood: no  Excessive worrying: no Irritability: no  Sweating: no Nausea: no Palpitations:no Hyperventilation: no Panic attacks: no Agoraphobia: no  Obscessions/compulsions: no Depressed mood: no Depression screen [facility] 2/9 06/29/2021 10/13/2020 06/23/2020 12/31/2018 11/28/2018  Decreased Interest 0 2 3 1 1   Down, Depressed, Hopeless 0 1 1 1 1   PHQ - 2 Score 0 3 4 2 2   Altered sleeping 0 2 2 0 0  Tired, decreased energy 1 2 3  0 0  Change in appetite 2 3 3  0 2  Feeling bad or failure about yourself  0 1 0 0 0  Trouble concentrating 0 1 1 1 1   Moving slowly or fidgety/restless 0 0 2 0 0  Suicidal thoughts 0 0 0 0 0  PHQ-9 Score 3 12 15 3 5   Difficult doing work/chores Not difficult at all Somewhat difficult  Somewhat difficult Somewhat difficult Somewhat difficult    GAD 7 : Generalized Anxiety Score 06/29/2021 10/13/2020 06/23/2020 11/20/2018  Nervous, Anxious, on Edge 2 3 3 2   Control/stop worrying 1 3 2 3   Worry too much - different things 1 3 2 3   Trouble relaxing 0 2 1 3   Restless 0 1 1 0  Easily annoyed or irritable 2 3 1 3   Afraid - awful might happen 0 1 1 3   Total GAD 7 Score 6 16 11 17   Anxiety Difficulty Not difficult at all Somewhat difficult Somewhat difficult Very difficult      Relevant past medical, surgical, family and social history reviewed and updated as indicated. Interim medical history since our last visit reviewed. Allergies and medications reviewed and updated.  Review of Systems  Constitutional: Negative.   Respiratory: Negative.    Cardiovascular: Negative.   Gastrointestinal: Negative.   Genitourinary: Negative.   Neurological: Negative.    Per HPI unless specifically indicated above     Objective:    BP 109/72   Pulse 71   Temp 98.2 F (36.8 C) (Oral)   Wt 118 lb (53.5 kg)   SpO2 100%   BMI 19.64 kg/m   Wt Readings from Last 3 Encounters:  06/29/21 118 lb (53.5 kg)  05/20/21  118 lb 9.6 oz (53.8 kg)  05/11/21 120 lb 12.8 oz (54.8 kg)    Physical Exam Vitals and nursing note reviewed.  Constitutional:      General: She is awake. She is not in acute distress.    Appearance: She is well-developed and well-groomed. She is not ill-appearing.  HENT:     Head: Normocephalic.     Right Ear: Hearing normal.     Left Ear: Hearing normal.  Eyes:     General: Lids are normal.        Right eye: No discharge.        Left eye: No discharge.     Pupils: Pupils are equal, round, and reactive to light.  Neck:     Thyroid: No thyromegaly.     Vascular: No carotid bruit.  Cardiovascular:     Rate and Rhythm: Normal rate and regular rhythm.     Heart sounds: Normal heart sounds. No murmur heard.   No gallop.  Pulmonary:     Effort: Pulmonary effort is  normal. No accessory muscle usage or respiratory distress.     Breath sounds: Normal breath sounds.  Abdominal:     General: Bowel sounds are normal.     Palpations: Abdomen is soft.  Musculoskeletal:     Cervical back: Normal range of motion and neck supple.     Right lower leg: No edema.     Left lower leg: No edema.  Skin:    General: Skin is warm and dry.  Neurological:     Mental Status: She is alert and oriented to person, place, and time.  Psychiatric:        Attention and Perception: Attention normal.        Mood and Affect: Mood normal.        Speech: Speech normal.        Behavior: Behavior normal. Behavior is cooperative.        Thought Content: Thought content normal.    Results for orders placed or performed in visit on 05/20/21  Pregnancy, urine  Result Value Ref Range   Preg Test, Ur Negative Negative      Assessment & Plan:   Problem List Items Addressed This Visit       Other   Bipolar I disorder (HCC) - Primary    Chronic, ongoing without SI/HI at this time.  Continue to collaborate with psychiatry at [facility].  Continue current medication regimen at this time as prescribed by them, upcoming visit on Thursday.   Return in 6 months for physical.      Birth control counseling    Discussed various options with patient -- she wishes to ponder these and will MyChart provider when decision made.        Follow up plan: Return in about 6 months (around 12/30/2021) for Annual physical with pap.

## 2021-06-29 NOTE — Patient Instructions (Signed)

## 2021-06-29 NOTE — Assessment & Plan Note (Signed)
Chronic, ongoing without SI/HI at this time.  Continue to collaborate with psychiatry at [facility].  Continue current medication regimen at this time as prescribed by them, upcoming visit on Thursday.   Return in 6 months for physical.

## 2021-08-12 ENCOUNTER — Ambulatory Visit (INDEPENDENT_AMBULATORY_CARE_PROVIDER_SITE_OTHER): Payer: Medicaid Other

## 2021-08-12 ENCOUNTER — Other Ambulatory Visit: Payer: Self-pay

## 2021-08-12 DIAGNOSIS — Z3042 Encounter for surveillance of injectable contraceptive: Secondary | ICD-10-CM

## 2021-08-12 DIAGNOSIS — Z3009 Encounter for other general counseling and advice on contraception: Secondary | ICD-10-CM

## 2021-08-12 MED ORDER — MEDROXYPROGESTERONE ACETATE 150 MG/ML IM SUSP
150.0000 mg | Freq: Once | INTRAMUSCULAR | Status: AC
  Administered 2021-08-12: 150 mg via INTRAMUSCULAR

## 2021-08-12 NOTE — Patient Instructions (Signed)
Follow up Dec 15 - Dec 29

## 2021-08-26 DIAGNOSIS — F3181 Bipolar II disorder: Secondary | ICD-10-CM | POA: Diagnosis not present

## 2021-08-26 DIAGNOSIS — F410 Panic disorder [episodic paroxysmal anxiety] without agoraphobia: Secondary | ICD-10-CM | POA: Diagnosis not present

## 2021-10-28 ENCOUNTER — Other Ambulatory Visit: Payer: Self-pay

## 2021-10-28 ENCOUNTER — Ambulatory Visit (INDEPENDENT_AMBULATORY_CARE_PROVIDER_SITE_OTHER): Payer: Medicaid Other

## 2021-10-28 DIAGNOSIS — Z3042 Encounter for surveillance of injectable contraceptive: Secondary | ICD-10-CM | POA: Diagnosis not present

## 2021-10-28 MED ORDER — MEDROXYPROGESTERONE ACETATE 150 MG/ML IM SUSP
150.0000 mg | Freq: Once | INTRAMUSCULAR | Status: AC
  Administered 2021-10-28: 150 mg via INTRAMUSCULAR

## 2022-01-02 ENCOUNTER — Encounter: Payer: Self-pay | Admitting: Nurse Practitioner

## 2022-01-02 DIAGNOSIS — Z789 Other specified health status: Secondary | ICD-10-CM | POA: Insufficient documentation

## 2022-01-02 NOTE — Patient Instructions (Incomplete)
Managing Anxiety, Adult ?After being diagnosed with anxiety, you may be relieved to know why you have felt or behaved a certain way. You may also feel overwhelmed about the treatment ahead and what it will mean for your life. With care and support, you can manage this condition. ?How to manage lifestyle changes ?Managing stress and anxiety ?Stress is your body's reaction to life changes and events, both good and bad. Most stress will last just a few hours, but stress can be ongoing and can lead to more than just stress. Although stress can play a major role in anxiety, it is not the same as anxiety. Stress is usually caused by something external, such as a deadline, test, or competition. Stress normally passes after the triggering event has ended.  ?Anxiety is caused by something internal, such as imagining a terrible outcome or worrying that something will go wrong that will devastate you. Anxiety often does not go away even after the triggering event is over, and it can become long-term (chronic) worry. It is important to understand the differences between stress and anxiety and to manage your stress effectively so that it does not lead to an anxious response. ?Talk with your health care provider or a counselor to learn more about reducing anxiety and stress. He or she may suggest tension reduction techniques, such as: ?Music therapy. Spend time creating or listening to music that you enjoy and that inspires you. ?Mindfulness-based meditation. Practice being aware of your normal breaths while not trying to control your breathing. It can be done while sitting or walking. ?Centering prayer. This involves focusing on a word, phrase, or sacred image that means something to you and brings you peace. ?Deep breathing. To do this, expand your stomach and inhale slowly through your nose. Hold your breath for 3-5 seconds. Then exhale slowly, letting your stomach muscles relax. ?Self-talk. Learn to notice and identify  thought patterns that lead to anxiety reactions and change those patterns to thoughts that feel peaceful. ?Muscle relaxation. Taking time to tense muscles and then relax them. ?Choose a tension reduction technique that fits your lifestyle and personality. These techniques take time and practice. Set aside 5-15 minutes a day to do them. Therapists can offer counseling and training in these techniques. The training to help with anxiety may be covered by some insurance plans. ?Other things you can do to manage stress and anxiety include: ?Keeping a stress diary. This can help you learn what triggers your reaction and then learn ways to manage your response. ?Thinking about how you react to certain situations. You may not be able to control everything, but you can control your response. ?Making time for activities that help you relax and not feeling guilty about spending your time in this way. ?Doing visual imagery. This involves imagining or creating mental pictures to help you relax. ?Practicing yoga. Through yoga poses, you can lower tension and promote relaxation. ? ?Medicines ?Medicines can help ease symptoms. Medicines for anxiety include: ?Antidepressant medicines. These are usually prescribed for long-term daily control. ?Anti-anxiety medicines. These may be added in severe cases, especially when panic attacks occur. ?Medicines will be prescribed by a health care provider. When used together, medicines, psychotherapy, and tension reduction techniques may be the most effective treatment. ?Relationships ?Relationships can play a big part in helping you recover. Try to spend more time connecting with trusted friends and family members. ?Consider going to couples counseling if you have a partner, taking family education classes, or going to family   therapy. ?Therapy can help you and others better understand your condition. ?How to recognize changes in your anxiety ?Everyone responds differently to treatment for  anxiety. Recovery from anxiety happens when symptoms decrease and stop interfering with your daily activities at home or work. This may mean that you will start to: ?Have better concentration and focus. Worry will interfere less in your daily thinking. ?Sleep better. ?Be less irritable. ?Have more energy. ?Have improved memory. ?It is also important to recognize when your condition is getting worse. Contact your health care provider if your symptoms interfere with home or work and you feel like your condition is not improving. ?Follow these instructions at home: ?Activity ?Exercise. Adults should do the following: ?Exercise for at least 150 minutes each week. The exercise should increase your heart rate and make you sweat (moderate-intensity exercise). ?Strengthening exercises at least twice a week. ?Get the right amount and quality of sleep. Most adults need 7-9 hours of sleep each night. ?Lifestyle ? ?Eat a healthy diet that includes plenty of vegetables, fruits, whole grains, low-fat dairy products, and lean protein. ?Do not eat a lot of foods that are high in fats, added sugars, or salt (sodium). ?Make choices that simplify your life. ?Do not use any products that contain nicotine or tobacco. These products include cigarettes, chewing tobacco, and vaping devices, such as e-cigarettes. If you need help quitting, ask your health care provider. ?Avoid caffeine, alcohol, and certain over-the-counter cold medicines. These may make you feel worse. Ask your pharmacist which medicines to avoid. ?General instructions ?Take over-the-counter and prescription medicines only as told by your health care provider. ?Keep all follow-up visits. This is important. ?Where to find support ?You can get help and support from these sources: ?Self-help groups. ?Online and community organizations. ?A trusted spiritual leader. ?Couples counseling. ?Family education classes. ?Family therapy. ?Where to find more information ?You may find  that joining a support group helps you deal with your anxiety. The following sources can help you locate counselors or support groups near you: ?Mental Health America: www.mentalhealthamerica.net ?Anxiety and Depression Association of America (ADAA): www.adaa.org ?National Alliance on Mental Illness (NAMI): www.nami.org ?Contact a health care provider if: ?You have a hard time staying focused or finishing daily tasks. ?You spend many hours a day feeling worried about everyday life. ?You become exhausted by worry. ?You start to have headaches or frequently feel tense. ?You develop chronic nausea or diarrhea. ?Get help right away if: ?You have a racing heart and shortness of breath. ?You have thoughts of hurting yourself or others. ?If you ever feel like you may hurt yourself or others, or have thoughts about taking your own life, get help right away. Go to your nearest emergency department or: ?Call your local emergency services (911 in the [place] ?Call a suicide crisis helpline, such as the National Suicide Prevention Lifeline at 1-800-273-8255 or 988 in the [place] This is open 24 hours a day in the [place] ?Text the Crisis Text Line at 741741 (in the [place] ?Summary ?Taking steps to learn and use tension reduction techniques can help calm you and help prevent triggering an anxiety reaction. ?When used together, medicines, psychotherapy, and tension reduction techniques may be the most effective treatment. ?Family, friends, and partners can play a big part in supporting you. ?This information is not intended to replace advice given to you by your health care provider. Make sure you discuss any questions you have with your health care provider. ?Document Revised: 05/26/2021 Document Reviewed: 02/21/2021 ?Elsevier Patient   Education ? 2022 Elsevier Inc. ? ?

## 2022-01-03 NOTE — Telephone Encounter (Signed)
Lmom asking pt to call back to reschedule appt. 

## 2022-01-06 ENCOUNTER — Encounter: Payer: Medicaid Other | Admitting: Nurse Practitioner

## 2022-01-06 DIAGNOSIS — Z789 Other specified health status: Secondary | ICD-10-CM

## 2022-01-06 DIAGNOSIS — Z124 Encounter for screening for malignant neoplasm of cervix: Secondary | ICD-10-CM

## 2022-01-06 DIAGNOSIS — Z1159 Encounter for screening for other viral diseases: Secondary | ICD-10-CM

## 2022-01-06 DIAGNOSIS — Z0000 Encounter for general adult medical examination without abnormal findings: Secondary | ICD-10-CM

## 2022-01-06 DIAGNOSIS — F319 Bipolar disorder, unspecified: Secondary | ICD-10-CM

## 2022-01-20 ENCOUNTER — Ambulatory Visit (INDEPENDENT_AMBULATORY_CARE_PROVIDER_SITE_OTHER): Payer: Medicaid Other

## 2022-01-20 ENCOUNTER — Other Ambulatory Visit: Payer: Self-pay

## 2022-01-20 DIAGNOSIS — Z3042 Encounter for surveillance of injectable contraceptive: Secondary | ICD-10-CM | POA: Diagnosis not present

## 2022-01-20 MED ORDER — MEDROXYPROGESTERONE ACETATE 150 MG/ML IM SUSP
150.0000 mg | Freq: Once | INTRAMUSCULAR | Status: AC
  Administered 2022-01-20: 150 mg via INTRAMUSCULAR

## 2022-03-06 DIAGNOSIS — S39012A Strain of muscle, fascia and tendon of lower back, initial encounter: Secondary | ICD-10-CM | POA: Diagnosis not present

## 2022-03-10 ENCOUNTER — Ambulatory Visit: Payer: Medicaid Other | Admitting: Nurse Practitioner

## 2022-04-07 ENCOUNTER — Ambulatory Visit: Payer: Medicaid Other

## 2022-04-08 ENCOUNTER — Ambulatory Visit (INDEPENDENT_AMBULATORY_CARE_PROVIDER_SITE_OTHER): Payer: Medicaid Other

## 2022-04-08 DIAGNOSIS — Z3042 Encounter for surveillance of injectable contraceptive: Secondary | ICD-10-CM

## 2022-04-08 MED ORDER — MEDROXYPROGESTERONE ACETATE 150 MG/ML IM SUSP
150.0000 mg | Freq: Once | INTRAMUSCULAR | Status: AC
  Administered 2022-04-08: 150 mg via INTRAMUSCULAR

## 2022-04-21 ENCOUNTER — Ambulatory Visit (INDEPENDENT_AMBULATORY_CARE_PROVIDER_SITE_OTHER): Payer: Medicaid Other

## 2022-04-27 DIAGNOSIS — Z111 Encounter for screening for respiratory tuberculosis: Secondary | ICD-10-CM | POA: Diagnosis not present

## 2022-04-29 DIAGNOSIS — Z111 Encounter for screening for respiratory tuberculosis: Secondary | ICD-10-CM | POA: Diagnosis not present

## 2022-06-20 ENCOUNTER — Other Ambulatory Visit: Payer: Self-pay | Admitting: Nurse Practitioner

## 2022-06-20 DIAGNOSIS — Z789 Other specified health status: Secondary | ICD-10-CM

## 2022-06-20 MED ORDER — MEDROXYPROGESTERONE ACETATE 150 MG/ML IM SUSP
150.0000 mg | INTRAMUSCULAR | Status: AC
  Administered 2022-07-01 – 2023-04-07 (×4): 150 mg via INTRAMUSCULAR

## 2022-07-01 ENCOUNTER — Ambulatory Visit (INDEPENDENT_AMBULATORY_CARE_PROVIDER_SITE_OTHER): Payer: Medicaid Other

## 2022-07-01 DIAGNOSIS — Z3042 Encounter for surveillance of injectable contraceptive: Secondary | ICD-10-CM

## 2022-07-01 DIAGNOSIS — Z789 Other specified health status: Secondary | ICD-10-CM | POA: Diagnosis not present

## 2022-07-01 NOTE — Progress Notes (Signed)
Patient presents today for he Medroxyprogesterone injection.  Patient is a week early. Patient received in her left deltoid, patient tolerated well.  Patient was informed that she may experience some symptoms since she is a week early and was informed of her next window for her next injection. Patient was also informed that she will also need to have an appointment with her PCP prior to receiving her next injection or she will not be able to get her next depo injection. Patient verbalized understanding.

## 2022-10-02 NOTE — Patient Instructions (Incomplete)
Bipolar I Disorder Bipolar I disorder is a mental health disorder in which a person has episodes of emotional highs (mania) and may also have episodes of lows (depression). Bipolar I disorder differs from other bipolar disorders in that it involves extreme episodes of mania (manic episodes). These episodes last at least one week or involve severe symptoms that require a hospital stay to keep the person safe. What are the causes? The cause of this condition is not known. What increases the risk? The following factors may make you more likely to develop this condition: Having a family member with the disorder. Having an imbalance of certain chemicals in the brain (neurotransmitters). Experiencing stress, such as from illness, financial problems, or a death. Having certain conditions that affect the brain or spinal cord (neurologic conditions). Having had a brain injury (trauma). What are the signs or symptoms? Symptoms of bipolar I disorder include the following: Symptoms of mania Very high self-esteem or self-confidence. Less need for sleep. Unusual talkativeness. Speech may be very fast. Racing thoughts with quick shifts between topics that may or may not be related (flight of ideas). Being much more able or much less able to concentrate. Increased purposeful activity, such as work, studies, or social activity. Increased agitation. This could be pacing, squirming, fidgeting, or finger and toe tapping. Impulsive behavior and poor judgment. These may lead to high-risk activities that are sexual, financial, or physical. Symptoms of depression Extreme sadness, uncontrollable crying, or feeling hopeless, worthless, or numb. Sleep problems, such as trouble falling asleep or staying asleep (insomnia), waking early, or sleeping too much. No longer enjoying things you used to enjoy. Isolation, or spending time alone often. Lack of energy or motivation, and moving more slowly than normal. Trouble  making decisions. Increased appetite or loss of appetite. Thoughts of death, or wanting to harm yourself. Sometimes, you may have a mixed mood. This means having symptoms of mania and depression at the same time. Stress can often trigger these symptoms. How is this diagnosed? This condition may be diagnosed based on a mental health evaluation that includes a review of: Emotional episodes. Medical history. Use of alcohol, drugs, and prescription medicines. Certain medical conditions and substances can cause secondary bipolar disorder. This has symptoms like the symptoms of bipolar disorder. Your health care provider may ask you to take a short test to help understand your symptoms. You may also be asked to follow up with a mental health provider for further evaluation or to start treatment. How is this treated?     This condition is a long-term (chronic) illness. It is often managed with ongoing treatment rather than treatment only when symptoms occur. A combination of treatments is often used. Treatment may include: Medicines, usually medicines called mood stabilizers. Medicines can be prescribed by a health care provider who specializes in treating mental health disorders (psychiatrist). If symptoms occur during use of a mood stabilizer, other medicines may be added. Talk therapy (psychotherapy) to help you manage bipolar disorder. Talk therapy includes cognitive behavioral therapy (CBT) and family therapy. Psychoeducation. This helps you and others understand how your disorder is managed. Include friends and family in educational sessions so they learn how best to support you. Methods of managing your condition, such as journaling or relaxation exercises. Exercises include: Yoga. Meditation. Deep breathing. Lifestyle changes, such as: Limiting alcohol and drug use. Exercising regularly. Setting a regular bedtime and wake time. Eating a healthy diet. Electroconvulsive therapy (ECT). This  is a procedure in which electricity is   applied to the brain through the scalp. ECT may be used for severe bipolar disorder when medicine and psychotherapy work too slowly or do not work. Follow these instructions at home: Activity Return to your normal activities as told by your health care provider. Find activities that you enjoy, and make time to do them. Get regular exercise. Lifestyle  Follow a set schedule for eating and sleeping. Eat a healthy diet that includes fresh fruits and vegetables, whole grains, low-fat dairy, and lean meat. Get at least 7-8 hours of sleep each night. Avoid using products that contain nicotine or tobacco. If you want help quitting, ask your health care provider. Avoid alcohol and drugs. They can affect how medicine works and make symptoms worse. General instructions Take over-the-counter and prescription medicines only as told by your health care provider. You may think about stopping your medicine, but you need to take all your medicine as prescribed. This helps manage your symptoms. Consider joining a support group. Your health care provider may be able to recommend one. Talk with your family and friends about your treatment goals and how loved ones can help. Keep all follow-up visits. This is important. Where to find more information National Alliance on Mental Illness: nami.org National Institute of Mental Health: nimh.nih.gov Contact a health care provider if: Your symptoms get worse, or your loved ones tell you that your symptoms are getting worse. You have uncomfortable side effects from your medicine. You have trouble sleeping. You have trouble doing daily activities. You feel unsafe in your surroundings. You are using alcohol or drugs to manage your symptoms. Get help right away if: You have new symptoms. You have thoughts about harming yourself or others. You are considering suicide. If you ever feel like you may hurt yourself or others, or  have thoughts about taking your own life, get help right away. Go to your nearest emergency department or: Call your local emergency services (911 in the [place] Call a suicide crisis helpline, such as the National Suicide Prevention Lifeline at 1-800-273-8255 or 988 in the [place] This is open 24 hours a day in the [place] Text the Crisis Text Line at 741741 (in the [place] Summary Bipolar I disorder is a lifelong mental health disorder in which a person has episodes of mania and depression. Treatment for this disorder involves a combination of treatments such as medicines and talk and behavioral therapies. Include friends and family in educational sessions so they know how best to support you. Get help right away if you are considering suicide. This information is not intended to replace advice given to you by your health care provider. Make sure you discuss any questions you have with your health care provider. Document Revised: 05/27/2021 Document Reviewed: 04/22/2021 Elsevier Patient Education  2023 Elsevier Inc.  

## 2022-10-04 ENCOUNTER — Ambulatory Visit: Payer: Medicaid Other | Admitting: Nurse Practitioner

## 2022-10-16 NOTE — Patient Instructions (Signed)

## 2022-10-19 ENCOUNTER — Encounter: Payer: Self-pay | Admitting: Nurse Practitioner

## 2022-10-19 ENCOUNTER — Ambulatory Visit: Payer: Medicaid Other | Admitting: Nurse Practitioner

## 2022-10-19 VITALS — BP 102/69 | HR 82 | Temp 98.2°F | Ht 65.0 in | Wt 122.5 lb

## 2022-10-19 DIAGNOSIS — Z136 Encounter for screening for cardiovascular disorders: Secondary | ICD-10-CM

## 2022-10-19 DIAGNOSIS — Z0000 Encounter for general adult medical examination without abnormal findings: Secondary | ICD-10-CM

## 2022-10-19 DIAGNOSIS — J301 Allergic rhinitis due to pollen: Secondary | ICD-10-CM | POA: Diagnosis not present

## 2022-10-19 DIAGNOSIS — Z789 Other specified health status: Secondary | ICD-10-CM | POA: Diagnosis not present

## 2022-10-19 DIAGNOSIS — F458 Other somatoform disorders: Secondary | ICD-10-CM | POA: Diagnosis not present

## 2022-10-19 DIAGNOSIS — F319 Bipolar disorder, unspecified: Secondary | ICD-10-CM

## 2022-10-19 DIAGNOSIS — Z13220 Encounter for screening for lipoid disorders: Secondary | ICD-10-CM

## 2022-10-19 DIAGNOSIS — Z3042 Encounter for surveillance of injectable contraceptive: Secondary | ICD-10-CM | POA: Diagnosis not present

## 2022-10-19 DIAGNOSIS — Z1159 Encounter for screening for other viral diseases: Secondary | ICD-10-CM | POA: Diagnosis not present

## 2022-10-19 LAB — PREGNANCY, URINE: Preg Test, Ur: NEGATIVE

## 2022-10-19 NOTE — Progress Notes (Signed)
BP 102/69   Pulse 82   Temp 98.2 F (36.8 C) (Oral)   Ht 5\' 5"  (1.651 m)   Wt 122 lb 8 oz (55.6 kg)   SpO2 98%   BMI 20.39 kg/m    Subjective:    Patient ID: , female    DOB: 12-May-2001, 21 y.o.   MRN: [id number]  HPI: Erica Long is a 21 y.o. female presenting on 10/19/2022 for comprehensive medical examination. Current medical complaints include:none  She currently lives with: family Menopausal Symptoms: no  BIRTH CONTROL COUNSELING: Continues on Depo shots every 3 months -- does not have a cycle with this.  Not currently sexually active.  When she started bled for 6 months straight, then no bleeding for one year.  Tolerating Depo well without issue, less issues with heavy bleeding.    ANXIETY/STRESS Was followed by [facility] in [place], has not seen them since December of last year -- [facility] could not contact provider and could not get refills -- she went cold January. Was on Trileptal 300 MG BID + Buspar, and Seroquel 200 MG QHS for Bipolar Disorder. She reports this worked well for her.  Currently is doing well without medication on board. Duration: stable Anxious mood: a little bit Excessive worrying:  a little bit Irritability: only at work Sweating: no Nausea: no Palpitations:no Hyperventilation: no Panic attacks: no Agoraphobia: no  Obscessions/compulsions: no Depressed mood: no    10/19/2022    1:35 PM 06/29/2021    4:11 PM 10/13/2020    4:40 PM 06/23/2020    2:54 PM 12/31/2018    8:36 AM  Depression screen PHQ 2/9  Decreased Interest 0 0 2 3 1   Down, Depressed, Hopeless 0 0 1 1 1   PHQ - 2 Score 0 0 3 4 2   Altered sleeping 0 0 2 2 0  Tired, decreased energy 1 1 2 3  0  Change in appetite 0 2 3 3  0  Feeling bad or failure about yourself  0 0 1 0 0  Trouble concentrating 1 0 1 1 1   Moving slowly or fidgety/restless 1 0 0 2 0  Suicidal thoughts 0 0 0 0 0  PHQ-9 Score 3 3 12 15 3   Difficult doing work/chores Not difficult at all  Not difficult at all Somewhat difficult Somewhat difficult Somewhat difficult       10/19/2022    1:35 PM 06/29/2021    4:10 PM 10/13/2020    4:40 PM 06/23/2020    2:53 PM  GAD 7 : Generalized Anxiety Score  Nervous, Anxious, on Edge 1 2 3 3   Control/stop worrying 2 1 3 2   Worry too much - different things 2 1 3 2   Trouble relaxing 1 0 2 1  Restless 0 0 1 1  Easily annoyed or irritable 2 2 3 1   Afraid - awful might happen 0 0 1 1  Total GAD 7 Score 8 6 16 11   Anxiety Difficulty Not difficult at all Not difficult at all Somewhat difficult Somewhat difficult      The patient does not have a history of falls. I did not complete a risk assessment for falls. A plan of care for falls was not documented.   Past Medical History:  Past Medical History:  Diagnosis Date   Bipolar 1 disorder (HCC)    HSV (herpes simplex virus) infection     Surgical History:  Past Surgical History:  Procedure Laterality Date   WISDOM TOOTH  EXTRACTION      Medications:  Current Outpatient Medications on File Prior to Visit  Medication Sig   cetirizine (ZYRTEC) 10 MG tablet Take 10 mg by mouth daily as needed.   fluticasone (FLONASE) 50 MCG/ACT nasal spray Place 1 spray into both nostrils daily.   medroxyPROGESTERone (DEPO-PROVERA) 150 MG/ML injection Inject into the muscle.   Current Facility-Administered Medications on File Prior to Visit  Medication   medroxyPROGESTERone (DEPO-PROVERA) injection 150 mg    Allergies:  Allergies  Allergen Reactions   Lamotrigine Hives    Social History:  Social History   Socioeconomic History   Marital status: Single    Spouse name: Not on file   Number of children: Not on file   Years of education: Not on file   Highest education level: Not on file  Occupational History   Not on file  Tobacco Use   Smoking status: Every Day    Types: E-cigarettes   Smokeless tobacco: Never  Vaping Use   Vaping Use: Every day   Substances: Nicotine  Substance  and Sexual Activity   Alcohol use: Not Currently   Drug use: Yes    Types: Marijuana    Comment: daily use   Sexual activity: Not Currently  Other Topics Concern   Not on file  Social History Narrative   Not on file   Social Determinants of Health   Financial Resource Strain: Not on file  Food Insecurity: Not on file  Transportation Needs: Not on file  Physical Activity: Not on file  Stress: Not on file  Social Connections: Not on file  Intimate Partner Violence: Not on file   Social History   Tobacco Use  Smoking Status Every Day   Types: E-cigarettes  Smokeless Tobacco Never   Social History   Substance and Sexual Activity  Alcohol Use Not Currently    Family History:  Family History  Problem Relation Age of Onset   Bipolar disorder Mother    Depression Mother    Heart attack Maternal Grandmother    COPD Paternal Grandmother    Depression Paternal Grandmother     Past medical history, surgical history, medications, allergies, family history and social history reviewed with patient today and changes made to appropriate areas of the chart.   ROS All other ROS negative except what is listed above and in the HPI.      Objective:    BP 102/69   Pulse 82   Temp 98.2 F (36.8 C) (Oral)   Ht 5\' 5"  (1.651 m)   Wt 122 lb 8 oz (55.6 kg)   SpO2 98%   BMI 20.39 kg/m   Wt Readings from Last 3 Encounters:  10/19/22 122 lb 8 oz (55.6 kg)  06/29/21 118 lb (53.5 kg)  05/20/21 118 lb 9.6 oz (53.8 kg)    Physical Exam Vitals and nursing note reviewed.  Constitutional:      General: She is awake. She is not in acute distress.    Appearance: She is well-developed. She is not ill-appearing.  HENT:     Head: Normocephalic and atraumatic.     Right Ear: Hearing, tympanic membrane, ear canal and external ear normal. No drainage.     Left Ear: Hearing, tympanic membrane, ear canal and external ear normal. No drainage.     Nose: Nose normal.     Right Sinus: No  maxillary sinus tenderness or frontal sinus tenderness.     Left Sinus: No maxillary sinus tenderness or  frontal sinus tenderness.     Mouth/Throat:     Mouth: Mucous membranes are moist.     Pharynx: Oropharynx is clear. Uvula midline. No pharyngeal swelling, oropharyngeal exudate or posterior oropharyngeal erythema.  Eyes:     General: Lids are normal.        Right eye: No discharge.        Left eye: No discharge.     Extraocular Movements: Extraocular movements intact.     Conjunctiva/sclera: Conjunctivae normal.     Pupils: Pupils are equal, round, and reactive to light.     Visual Fields: Right eye visual fields normal and left eye visual fields normal.  Neck:     Thyroid: No thyromegaly.     Vascular: No carotid bruit.     Trachea: Trachea normal.  Cardiovascular:     Rate and Rhythm: Normal rate and regular rhythm.     Heart sounds: Normal heart sounds. No murmur heard.    No gallop.  Pulmonary:     Effort: Pulmonary effort is normal. No accessory muscle usage or respiratory distress.     Breath sounds: Normal breath sounds.  Abdominal:     General: Bowel sounds are normal.     Palpations: Abdomen is soft. There is no hepatomegaly or splenomegaly.     Tenderness: There is no abdominal tenderness.  Musculoskeletal:        General: Normal range of motion.     Cervical back: Normal range of motion and neck supple.     Right lower leg: No edema.     Left lower leg: No edema.  Lymphadenopathy:     Head:     Right side of head: No submental, submandibular, tonsillar, preauricular or posterior auricular adenopathy.     Left side of head: No submental, submandibular, tonsillar, preauricular or posterior auricular adenopathy.     Cervical: No cervical adenopathy.  Skin:    General: Skin is warm and dry.     Capillary Refill: Capillary refill takes less than 2 seconds.     Findings: No rash.  Neurological:     Mental Status: She is alert and oriented to person, place, and  time.     Gait: Gait is intact.     Deep Tendon Reflexes: Reflexes are normal and symmetric.     Reflex Scores:      Brachioradialis reflexes are 2+ on the right side and 2+ on the left side.      Patellar reflexes are 2+ on the right side and 2+ on the left side. Psychiatric:        Attention and Perception: Attention normal.        Mood and Affect: Mood normal.        Speech: Speech normal.        Behavior: Behavior normal. Behavior is cooperative.        Thought Content: Thought content normal.        Judgment: Judgment normal.    Results for orders placed or performed in visit on 05/20/21  Pregnancy, urine  Result Value Ref Range   Preg Test, Ur Negative Negative      Assessment & Plan:   Problem List Items Addressed This Visit       Other   Bipolar I disorder (HCC) - Primary    Chronic, stable at this time without medication and does not wish to restart.  In past took Trileptal 300 MG BID + Buspar, and Seroquel 200 MG QHS --  will return to this if needed in future.  Discussed with her.  Denies SI/HI.  Return to psychiatry as needed.      Relevant Orders   CBC with Differential/Platelet   Uses Depo-Provera as primary birth control method    Urine pregnancy testing negative today.  Check CMP and lipid panel.  Depo provided today.      Relevant Orders   POCT urine pregnancy   Other Visit Diagnoses     Encounter for lipid screening for cardiovascular disease       Relevant Orders   Comprehensive metabolic panel   Lipid Panel w/o Chol/HDL Ratio   Need for hepatitis C screening test       Hep C screen on labs today per guidelines for one time screening, discussed with patient.   Relevant Orders   Hepatitis C antibody   Encounter for annual physical exam       Annual physical today with labs and health maintenance reviewed, discussed with patient.   Relevant Orders   CBC with Differential/Platelet   TSH        Follow up plan: Return in about 1 year (around  10/20/2023) for Annual Exam with pap.   LABORATORY TESTING:  - Pap smear: is nervous, provided education on this and support -- will obtain next year  IMMUNIZATIONS:   - Tdap: Tetanus vaccination status reviewed: refused. - Influenza: Refused - Pneumovax: Not applicable - Prevnar: Not applicable - COVID: Refused - HPV: Refused - Shingrix vaccine: Not applicable  SCREENING: -Mammogram: Not applicable  - Colonoscopy: Not applicable  - Bone Density: Not applicable  -Hearing Test: Not applicable  -Spirometry: Not applicable   PATIENT COUNSELING:   Advised to take 1 mg of folate supplement per day if capable of pregnancy.   Sexuality: Discussed sexually transmitted diseases, partner selection, use of condoms, avoidance of unintended pregnancy  and contraceptive alternatives.   Advised to avoid cigarette smoking.  I discussed with the patient that most people either abstain from alcohol or drink within safe limits (<=14/week and <=4 drinks/occasion for males, <=7/weeks and <= 3 drinks/occasion for females) and that the risk for alcohol disorders and other health effects rises proportionally with the number of drinks per week and how often a drinker exceeds daily limits.  Discussed cessation/primary prevention of drug use and availability of treatment for abuse.   Diet: Encouraged to adjust caloric intake to maintain  or achieve ideal body weight, to reduce intake of dietary saturated fat and total fat, to limit sodium intake by avoiding high sodium foods and not adding table salt, and to maintain adequate dietary potassium and calcium preferably from fresh fruits, vegetables, and low-fat dairy products.    Stressed the importance of regular exercise  Injury prevention: Discussed safety belts, safety helmets, smoke detector, smoking near bedding or upholstery.   Dental health: Discussed importance of regular tooth brushing, flossing, and dental visits.    NEXT PREVENTATIVE PHYSICAL  DUE IN 1 YEAR. Return in about 1 year (around 10/20/2023) for Annual Exam with pap.

## 2022-10-19 NOTE — Assessment & Plan Note (Signed)
Chronic, stable at this time without medication and does not wish to restart.  In past took Trileptal 300 MG BID + Buspar, and Seroquel 200 MG QHS -- will return to this if needed in future.  Discussed with her.  Denies SI/HI.  Return to psychiatry as needed.

## 2022-10-19 NOTE — Assessment & Plan Note (Signed)
Urine pregnancy testing negative today.  Check CMP and lipid panel.  Depo provided today.

## 2022-10-20 LAB — COMPREHENSIVE METABOLIC PANEL
ALT: 21 IU/L (ref 0–32)
AST: 20 IU/L (ref 0–40)
Albumin/Globulin Ratio: 2 (ref 1.2–2.2)
Albumin: 4.6 g/dL (ref 4.0–5.0)
Alkaline Phosphatase: 64 IU/L (ref 44–121)
BUN/Creatinine Ratio: 11 (ref 9–23)
BUN: 12 mg/dL (ref 6–20)
Bilirubin Total: 0.7 mg/dL (ref 0.0–1.2)
CO2: 22 mmol/L (ref 20–29)
Calcium: 9.6 mg/dL (ref 8.7–10.2)
Chloride: 103 mmol/L (ref 96–106)
Creatinine, Ser: 1.06 mg/dL — ABNORMAL HIGH (ref 0.57–1.00)
Globulin, Total: 2.3 g/dL (ref 1.5–4.5)
Glucose: 73 mg/dL (ref 70–99)
Potassium: 4.6 mmol/L (ref 3.5–5.2)
Sodium: 140 mmol/L (ref 134–144)
Total Protein: 6.9 g/dL (ref 6.0–8.5)
eGFR: 76 mL/min/{1.73_m2} (ref >59)

## 2022-10-20 LAB — CBC WITH DIFFERENTIAL/PLATELET
Basophils Absolute: 0 10*3/uL (ref 0.0–0.2)
Basos: 1 %
EOS (ABSOLUTE): 0.1 10*3/uL (ref 0.0–0.4)
Eos: 1 %
Hematocrit: 40.6 % (ref 34.0–46.6)
Hemoglobin: 13.6 g/dL (ref 11.1–15.9)
Immature Grans (Abs): 0 10*3/uL (ref 0.0–0.1)
Immature Granulocytes: 0 %
Lymphocytes Absolute: 1.4 10*3/uL (ref 0.7–3.1)
Lymphs: 23 %
MCH: 29.4 pg (ref 26.6–33.0)
MCHC: 33.5 g/dL (ref 31.5–35.7)
MCV: 88 fL (ref 79–97)
Monocytes Absolute: 0.4 10*3/uL (ref 0.1–0.9)
Monocytes: 6 %
Neutrophils Absolute: 4.4 10*3/uL (ref 1.4–7.0)
Neutrophils: 69 %
Platelets: 293 10*3/uL (ref 150–450)
RBC: 4.63 x10E6/uL (ref 3.77–5.28)
RDW: 12.4 % (ref 11.7–15.4)
WBC: 6.3 10*3/uL (ref 3.4–10.8)

## 2022-10-20 LAB — LIPID PANEL W/O CHOL/HDL RATIO
Cholesterol, Total: 142 mg/dL (ref 100–199)
HDL: 60 mg/dL (ref >39)
LDL Chol Calc (NIH): 71 mg/dL (ref 0–99)
Triglycerides: 51 mg/dL (ref 0–149)
VLDL Cholesterol Cal: 11 mg/dL (ref 5–40)

## 2022-10-20 LAB — HEPATITIS C ANTIBODY: Hep C Virus Ab: NONREACTIVE

## 2022-10-20 LAB — TSH: TSH: 1.22 u[IU]/mL (ref 0.450–4.500)

## 2022-10-20 NOTE — Progress Notes (Signed)
Contacted via MyChart   Good morning Erica Long, your labs have returned and are all nice and normal.  No changes needed!!  Keep up the great work!! Keep being amazing!!  Thank you for allowing me to participate in your care.  I appreciate you. Kindest regards, Zaid Tomes

## 2022-11-04 ENCOUNTER — Encounter: Payer: Self-pay | Admitting: Nurse Practitioner

## 2022-11-04 ENCOUNTER — Ambulatory Visit: Payer: Medicaid Other | Admitting: Nurse Practitioner

## 2022-11-04 VITALS — BP 104/70 | HR 81 | Temp 98.0°F | Ht 65.0 in | Wt 122.8 lb

## 2022-11-04 DIAGNOSIS — J029 Acute pharyngitis, unspecified: Secondary | ICD-10-CM | POA: Diagnosis not present

## 2022-11-04 MED ORDER — LIDOCAINE VISCOUS HCL 2 % MT SOLN: 15.0000 mL | OROMUCOSAL | 0 refills | Status: DC | PRN

## 2022-11-04 NOTE — Progress Notes (Signed)
BP 104/70   Pulse 81   Temp 98 F (36.7 C) (Oral)   Ht _0  (1.651 m)   Wt 122 lb 12.8 oz (55.7 kg)   SpO2 98%   BMI 20.43 kg/m    Subjective:    Patient ID: Erica Long, female    DOB: September 20, 2001, 21 y.o.   MRN: [id number]  HPI: Erica Long is a 21 y.o. female  Chief Complaint  Patient presents with   Tonsil stones    Noticed on Monday   TONSIL ISSUES: She presents today for tonsil stones concern -- although improving today.  Worse 2-3 days ago.  This is her first experience with these.  Once when she coughed hard something hard came out and was in mouth.  Has history of frequent strep throat and other issues -- seen ENT in past, not in 3 years. Fever: no Cough: no Shortness of breath: no Wheezing: no Chest pain: no Chest tightness: no Chest congestion: no Nasal congestion: no Runny nose: no Post nasal drip: no Sneezing: no Sore throat: yes Swollen glands: yes Sinus pressure: no Headache: no Face pain: no Toothache: no Ear pain: none Ear pressure: none Eyes red/itching:no Eye drainage/crusting: no  Vomiting: no Rash: no Fatigue: no Sick contacts: no Strep contacts: no  Context: better Recurrent sinusitis: no Relief with OTC cold/cough medications: yes  Treatments attempted: salt/water gargling, sore throat tablets, Flonase    Relevant past medical, surgical, family and social history reviewed and updated as indicated. Interim medical history since our last visit reviewed. Allergies and medications reviewed and updated.  Review of Systems  Constitutional:  Positive for fatigue. Negative for activity change, appetite change, diaphoresis and fever.  HENT:  Positive for sore throat. Negative for congestion, ear discharge, ear pain, nosebleeds, postnasal drip, rhinorrhea, sinus pressure and sinus pain.   Respiratory:  Negative for cough, chest tightness and shortness of breath.   Cardiovascular:  Negative for chest pain, palpitations and leg swelling.   Gastrointestinal: Negative.   Neurological: Negative.   Psychiatric/Behavioral: Negative.     Per HPI unless specifically indicated above     Objective:    BP 104/70   Pulse 81   Temp 98 F (36.7 C) (Oral)   Ht _1  (1.651 m)   Wt 122 lb 12.8 oz (55.7 kg)   SpO2 98%   BMI 20.43 kg/m   Wt Readings from Last 3 Encounters:  11/04/22 122 lb 12.8 oz (55.7 kg)  10/19/22 122 lb 8 oz (55.6 kg)  06/29/21 118 lb (53.5 kg)    Physical Exam Vitals and nursing note reviewed.  Constitutional:      General: She is awake. She is not in acute distress.    Appearance: She is well-developed and well-groomed. She is not ill-appearing or toxic-appearing.  HENT:     Head: Normocephalic.     Right Ear: Hearing, tympanic membrane, ear canal and external ear normal.     Left Ear: Hearing, tympanic membrane, ear canal and external ear normal.     Nose: Nose normal. No rhinorrhea.     Right Sinus: No maxillary sinus tenderness or frontal sinus tenderness.     Left Sinus: No maxillary sinus tenderness or frontal sinus tenderness.     Mouth/Throat:     Mouth: Mucous membranes are moist.     Pharynx: Posterior oropharyngeal erythema (mild noted) present. No pharyngeal swelling or oropharyngeal exudate.     Tonsils: No tonsillar exudate or tonsillar abscesses. 2+  on the right. 2+ on the left.   Eyes:     General: Lids are normal.        Right eye: No discharge.        Left eye: No discharge.     Pupils: Pupils are equal, round, and reactive to light.  Neck:     Thyroid: No thyromegaly.     Vascular: No carotid bruit.  Cardiovascular:     Rate and Rhythm: Normal rate and regular rhythm.     Heart sounds: Normal heart sounds. No murmur heard.    No gallop.  Pulmonary:     Effort: Pulmonary effort is normal. No accessory muscle usage or respiratory distress.     Breath sounds: Normal breath sounds.  Abdominal:     General: Bowel sounds are normal.     Palpations: Abdomen is soft.   Musculoskeletal:     Cervical back: Normal range of motion and neck supple.     Right lower leg: No edema.     Left lower leg: No edema.  Skin:    General: Skin is warm and dry.  Neurological:     Mental Status: She is alert and oriented to person, place, and time.  Psychiatric:        Attention and Perception: Attention normal.        Mood and Affect: Mood normal.        Speech: Speech normal.        Behavior: Behavior normal. Behavior is cooperative.        Thought Content: Thought content normal.    Results for orders placed or performed in visit on 10/19/22  CBC with Differential/Platelet  Result Value Ref Range   WBC 6.3 3.4 - 10.8 x10E3/uL   RBC 4.63 3.77 - 5.28 x10E6/uL   Hemoglobin 13.6 11.1 - 15.9 g/dL   Hematocrit 40.6 34.0 - 46.6 %   MCV 88 79 - 97 fL   MCH 29.4 26.6 - 33.0 pg   MCHC 33.5 31.5 - 35.7 g/dL   RDW 12.4 11.7 - 15.4 %   Platelets 293 150 - 450 x10E3/uL   Neutrophils 69 Not Estab. %   Lymphs 23 Not Estab. %   Monocytes 6 Not Estab. %   Eos 1 Not Estab. %   Basos 1 Not Estab. %   Neutrophils Absolute 4.4 1.4 - 7.0 x10E3/uL   Lymphocytes Absolute 1.4 0.7 - 3.1 x10E3/uL   Monocytes Absolute 0.4 0.1 - 0.9 x10E3/uL   EOS (ABSOLUTE) 0.1 0.0 - 0.4 x10E3/uL   Basophils Absolute 0.0 0.0 - 0.2 x10E3/uL   Immature Granulocytes 0 Not Estab. %   Immature Grans (Abs) 0.0 0.0 - 0.1 x10E3/uL  Comprehensive metabolic panel  Result Value Ref Range   Glucose 73 70 - 99 mg/dL   BUN 12 6 - 20 mg/dL   Creatinine, Ser 1.06 (H) 0.57 - 1.00 mg/dL   eGFR 76 >59 mL/min/1.73   BUN/Creatinine Ratio 11 9 - 23   Sodium 140 134 - 144 mmol/L   Potassium 4.6 3.5 - 5.2 mmol/L   Chloride 103 96 - 106 mmol/L   CO2 22 20 - 29 mmol/L   Calcium 9.6 8.7 - 10.2 mg/dL   Total Protein 6.9 6.0 - 8.5 g/dL   Albumin 4.6 4.0 - 5.0 g/dL   Globulin, Total 2.3 1.5 - 4.5 g/dL   Albumin/Globulin Ratio 2.0 1.2 - 2.2   Bilirubin Total 0.7 0.0 - 1.2 mg/dL   Alkaline Phosphatase 64  44 - 121  IU/L   AST 20 0 - 40 IU/L   ALT 21 0 - 32 IU/L  Lipid Panel w/o Chol/HDL Ratio  Result Value Ref Range   Cholesterol, Total 142 100 - 199 mg/dL   Triglycerides 51 0 - 149 mg/dL   HDL 60 >39 mg/dL   VLDL Cholesterol Cal 11 5 - 40 mg/dL   LDL Chol Calc (NIH) 71 0 - 99 mg/dL  TSH  Result Value Ref Range   TSH 1.220 0.450 - 4.500 uIU/mL  Hepatitis C antibody  Result Value Ref Range   Hep C Virus Ab Non Reactive Non Reactive  Pregnancy, urine  Result Value Ref Range   Preg Test, Ur Negative Negative      Assessment & Plan:   Problem List Items Addressed This Visit       Other   Sore throat - Primary    Acute with stones.  At this time recommend continue Flonase and add on Claritin 10 MG daily for allergies.  Discussed with her stones will pass on own.  Send in Viscous Lidocaine for discomfort.  Will perform strep test and treat as needed.      Relevant Orders   Rapid Strep screen([facility])     Follow up plan: Return if symptoms worsen or fail to improve.

## 2022-11-04 NOTE — Assessment & Plan Note (Signed)
Acute with stones.  At this time recommend continue Flonase and add on Claritin 10 MG daily for allergies.  Discussed with her stones will pass on own.  Send in Viscous Lidocaine for discomfort.  Will perform strep test and treat as needed.

## 2022-11-04 NOTE — Patient Instructions (Signed)
Sore Throat When you have a sore throat, your throat may feel: Tender. Burning. Irritated. Scratchy. Painful when you swallow. Painful when you talk. Many things can cause a sore throat, such as: An infection. Allergies. Dry air. Smoke or pollution. Radiation treatment for cancer. Gastroesophageal reflux disease (GERD). A tumor. A sore throat can be the first sign of another sickness. It can happen with other problems, like: Coughing. Sneezing. Fever. Swelling of the glands in the neck. Most sore throats go away without treatment. Follow these instructions at home:     Medicines Take over-the-counter and prescription medicines only as told by your doctor. Children often get sore throats. Do not give your child aspirin. Use throat sprays to soothe your throat as told by your health care provider. Managing pain To help with pain: Sip warm liquids, such as broth, herbal tea, or warm water. Eat or drink cold or frozen liquids, such as frozen ice pops. Rinse your mouth (gargle) with a salt water mixture 3-4 times a day or as needed. To make salt water, dissolve -1 tsp (3-6 g) of salt in 1 cup (237 mL) of warm water. Do not swallow this mixture. Suck on hard candy or throat lozenges. Put a cool-mist humidifier in your bedroom at night. Sit in the bathroom with the door closed for 5-10 minutes while you run hot water in the shower. General instructions Do not smoke or use any products that contain nicotine or tobacco. If you need help quitting, ask your doctor. Get plenty of rest. Drink enough fluid to keep your pee (urine) pale yellow. Wash your hands often for at least 20 seconds with soap and water. If soap and water are not available, use hand sanitizer. Contact a doctor if: You have a fever for more than 2-3 days. You keep having symptoms for more than 2-3 days. Your throat does not get better in 7 days. You have a fever and your symptoms suddenly get worse. Your  child who is 3 months to 3 years old has a temperature of 102.2F (39C) or higher. Get help right away if: You have trouble breathing. You cannot swallow fluids, soft foods, or your spit. You have swelling in your throat or neck that gets worse. You feel like you may vomit (nauseous) and this feeling lasts a long time. You cannot stop vomiting. These symptoms may be an emergency. Get help right away. Call your local emergency services (911 in the [place] Do not wait to see if the symptoms will go away. Do not drive yourself to the hospital. Summary A sore throat is a painful, burning, irritated, or scratchy throat. Many things can cause a sore throat. Take over-the-counter medicines only as told by your doctor. Get plenty of rest. Drink enough fluid to keep your pee (urine) pale yellow. Contact a doctor if your symptoms get worse or your sore throat does not get better within 7 days. This information is not intended to replace advice given to you by your health care provider. Make sure you discuss any questions you have with your health care provider. Document Revised: 01/27/2021 Document Reviewed: 01/27/2021 Elsevier Patient Education  2023 [employer]  

## 2022-11-04 NOTE — Progress Notes (Signed)
Please let Shinita know there is no strep.

## 2022-11-08 LAB — RAPID STREP SCREEN (MED CTR MEBANE ONLY): Strep Gp A Ag, IA W/Reflex: NEGATIVE

## 2022-11-08 LAB — CULTURE, GROUP A STREP: Strep A Culture: NEGATIVE

## 2022-12-06 DIAGNOSIS — H5213 Myopia, bilateral: Secondary | ICD-10-CM | POA: Diagnosis not present

## 2022-12-18 NOTE — Patient Instructions (Signed)
Pelvic Pain, Female Pelvic pain is pain in your lower belly (abdomen), below your belly button and between your hips. The pain may: Start all of a sudden (be acute). Keep coming back (be recurring). Last a long time (become chronic). Pelvic pain that lasts longer than 6 months is called chronic pelvic pain. There are many causes of pelvic pain. Sometimes the cause of pelvic pain is not known. Follow these instructions at home:  Take over-the-counter and prescription medicines only as told by your doctor. Rest as told by your doctor. Do not have sex if it hurts. Keep a journal of your pelvic pain. Write down: When the pain started. Where the pain is located. What seems to make the pain better or worse, such as food or your monthly period (menstrual cycle). Any symptoms you have along with the pain. Keep all follow-up visits. Contact a doctor if: Medicine does not help your pain, or your pain comes back. You have new symptoms. You have unusual discharge or bleeding from your vagina. You have a fever or chills. You are having trouble pooping (constipation). You have blood in your pee (urine) or poop (stool). Your pee smells bad. You feel weak or light-headed. Get help right away if: You have sudden pain that is very bad. You have very bad pain and also have any of these symptoms: A fever. Feeling like you may vomit (nauseous). Vomiting. Being very sweaty. You faint. These symptoms may be an emergency. Get help right away. Call your local emergency services (911 in the [place] Do not wait to see if the symptoms will go away. Do not drive yourself to the hospital. Summary Pelvic pain is pain in your lower belly (abdomen), below your belly button and between your hips. There are many causes of pelvic pain. Keep a journal of your pelvic pain. This information is not intended to replace advice given to you by your health care provider. Make sure you discuss any questions you have with  your health care provider. Document Revised: 03/09/2021 Document Reviewed: 03/09/2021 Elsevier Patient Education  2023 Elsevier Inc.   

## 2022-12-21 ENCOUNTER — Other Ambulatory Visit (HOSPITAL_COMMUNITY)
Admission: RE | Admit: 2022-12-21 | Discharge: 2022-12-21 | Disposition: A | Discharge status: Home / Self Care | Payer: Medicaid Other | Source: Ambulatory Visit | Attending: Nurse Practitioner | Admitting: Nurse Practitioner

## 2022-12-21 ENCOUNTER — Encounter: Payer: Self-pay | Admitting: Nurse Practitioner

## 2022-12-21 ENCOUNTER — Ambulatory Visit (INDEPENDENT_AMBULATORY_CARE_PROVIDER_SITE_OTHER): Payer: Medicaid Other | Admitting: Nurse Practitioner

## 2022-12-21 VITALS — BP 109/73 | HR 83 | Temp 98.1°F | Ht 65.0 in | Wt 124.6 lb

## 2022-12-21 DIAGNOSIS — Z789 Other specified health status: Secondary | ICD-10-CM | POA: Diagnosis not present

## 2022-12-21 DIAGNOSIS — R102 Pelvic and perineal pain: Secondary | ICD-10-CM | POA: Diagnosis not present

## 2022-12-21 DIAGNOSIS — Z124 Encounter for screening for malignant neoplasm of cervix: Secondary | ICD-10-CM | POA: Diagnosis not present

## 2022-12-21 DIAGNOSIS — Z3042 Encounter for surveillance of injectable contraceptive: Secondary | ICD-10-CM

## 2022-12-21 NOTE — Progress Notes (Signed)
BP 109/73   Pulse 83   Temp 98.1 F (36.7 C) (Oral)   Ht 5\' 5"  (1.651 m)   Wt 124 lb 9.6 oz (56.5 kg)   SpO2 91%   BMI 20.73 kg/m    Subjective:    Patient ID: Erica Long, female    DOB: Aug 16, 2001, 22 y.o.   MRN: [id number]  HPI: Erica Long is a 22 y.o. female  Chief Complaint  Patient presents with   Abdominal Pain    Has lower abd pain for over a week in a half, has resolved at this time, would like to be checked for endometriosis. Patient would also like to be checked for HPV, cervical cancer.   PELVIC PAIN Presents today for complaint of lower abdominal pain for over 1 1/2 weeks, past few days has had no pain + was having bright red spotting.  Would like to have pap obtained today.  Does take Depo injections.  Has family history of endometriosis, ovarian, and cervical cancer. Gravida/Para: 0/0 Duration: weeks Onset: sudden Severity: 9/10 Quality: sharp, aching, and throbbing Frequency: intermittent Episode duration: varied in duration Alleviating factors: heating pad Aggravating factors: standing Previous evaluation/studies: no Treatments attempted: heating pad Fevers: no Nausea/vomiting: yes Vaginal discharge: yes Dysmenorrhea: no Dyspareunia: no Weight loss: no Dysuria/urinary frequency: no Hematuria: no Sexual activity: yes - not in past 3 months Contraception: yes History STDs: yes -- HSV2 History GYN procedures: no Previous pap smear: no   Relevant past medical, surgical, family and social history reviewed and updated as indicated. Interim medical history since our last visit reviewed. Allergies and medications reviewed and updated.  Review of Systems  Constitutional:  Negative for activity change, appetite change, diaphoresis, fatigue and fever.  Respiratory:  Negative for cough, chest tightness and shortness of breath.   Cardiovascular:  Negative for chest pain, palpitations and leg swelling.  Gastrointestinal:  Positive for abdominal  pain, nausea and vomiting. Negative for abdominal distention, constipation and diarrhea.  Genitourinary:  Positive for vaginal bleeding. Negative for dysuria, frequency, genital sores, hematuria, urgency, vaginal discharge and vaginal pain.  Neurological: Negative.   Psychiatric/Behavioral: Negative.     Per HPI unless specifically indicated above     Objective:    BP 109/73   Pulse 83   Temp 98.1 F (36.7 C) (Oral)   Ht 5\' 5"  (1.651 m)   Wt 124 lb 9.6 oz (56.5 kg)   SpO2 91%   BMI 20.73 kg/m   Wt Readings from Last 3 Encounters:  12/21/22 124 lb 9.6 oz (56.5 kg)  11/04/22 122 lb 12.8 oz (55.7 kg)  10/19/22 122 lb 8 oz (55.6 kg)    Physical Exam Vitals and nursing note reviewed. Exam conducted with a chaperone present.  Constitutional:      General: She is awake. She is not in acute distress.    Appearance: She is well-developed and well-groomed. She is not ill-appearing or toxic-appearing.  HENT:     Head: Normocephalic.     Right Ear: Hearing and external ear normal.     Left Ear: Hearing and external ear normal.  Eyes:     General: Lids are normal.        Right eye: No discharge.        Left eye: No discharge.     Conjunctiva/sclera: Conjunctivae normal.     Pupils: Pupils are equal, round, and reactive to light.  Neck:     Thyroid: No thyromegaly.     Vascular:  No carotid bruit.  Cardiovascular:     Rate and Rhythm: Normal rate and regular rhythm.     Heart sounds: Normal heart sounds. No murmur heard.    No gallop.  Pulmonary:     Effort: Pulmonary effort is normal. No accessory muscle usage or respiratory distress.     Breath sounds: Normal breath sounds.  Abdominal:     General: Bowel sounds are normal.     Palpations: Abdomen is soft. There is no hepatomegaly or splenomegaly.     Hernia: There is no hernia in the left inguinal area or right inguinal area.  Genitourinary:    Exam position: Lithotomy position.     Pubic Area: No rash.      Labia:         Right: No rash.        Left: No rash.      Urethra: No prolapse.     Vagina: Normal.     Cervix: Normal.     Uterus: Normal.      Adnexa: Right adnexa normal and left adnexa normal.     Rectum: Normal.     Comments: Cervix anterior and viewed.  Pap obtained. Musculoskeletal:     Cervical back: Normal range of motion and neck supple.     Right lower leg: No edema.     Left lower leg: No edema.  Skin:    General: Skin is warm and dry.  Neurological:     Mental Status: She is alert and oriented to person, place, and time.  Psychiatric:        Attention and Perception: Attention normal.        Mood and Affect: Mood normal.        Speech: Speech normal.        Behavior: Behavior normal. Behavior is cooperative.        Thought Content: Thought content normal.     Results for orders placed or performed in visit on 11/04/22  Rapid Strep screen([facility])   Specimen: Other   Other  Result Value Ref Range   Strep Gp A Ag, IA W/Reflex Negative Negative  Culture, Group A Strep   Other  Result Value Ref Range   Strep A Culture Negative       Assessment & Plan:   Problem List Items Addressed This Visit       Other   Pelvic pain - Primary    Ongoing for 1 1/2 weeks, but improved past 3 days.  Significant family history of endometriosis and cancer cervical and ovarian.  Will obtain ultrasound of pelvis.  Obtained initial pap today and sent to lab.  Urine for UA and for GC/Chlam today.  Determine next steps after return of imaging and pap testing -- will send to GYN as needed.      Relevant Orders   GC/Chlamydia Probe Amp   Urinalysis, Routine w reflex microscopic   US Pelvic Complete With Transvaginal   Other Visit Diagnoses     Cervical cancer screening       Pap obtained and sent to lab.   Relevant Orders   Cytology - PAP        Follow up plan: Return for as scheduled.

## 2022-12-21 NOTE — Assessment & Plan Note (Signed)
Ongoing for 1 1/2 weeks, but improved past 3 days.  Significant family history of endometriosis and cancer cervical and ovarian.  Will obtain ultrasound of pelvis.  Obtained initial pap today and sent to lab.  Urine for UA and for GC/Chlam today.  Determine next steps after return of imaging and pap testing -- will send to GYN as needed.

## 2022-12-22 LAB — URINALYSIS, ROUTINE W REFLEX MICROSCOPIC
Bilirubin, UA: NEGATIVE
Glucose, UA: NEGATIVE
Ketones, UA: NEGATIVE
Leukocytes,UA: NEGATIVE
Nitrite, UA: NEGATIVE
Protein,UA: NEGATIVE
RBC, UA: NEGATIVE
Specific Gravity, UA: 1.013 (ref 1.005–1.030)
Urobilinogen, Ur: 0.2 mg/dL (ref 0.2–1.0)
pH, UA: 6 (ref 5.0–7.5)

## 2022-12-22 NOTE — Progress Notes (Signed)
Contacted via MyChart   Good morning Erica Long, your urinalysis came back negative for infection.:)

## 2022-12-26 LAB — GC/CHLAMYDIA PROBE AMP
Chlamydia trachomatis, NAA: NEGATIVE
Neisseria Gonorrhoeae by PCR: NEGATIVE

## 2022-12-26 NOTE — Progress Notes (Signed)
Contacted via [place] and chlamydia testing returned negative:)

## 2022-12-27 LAB — CYTOLOGY - PAP: Diagnosis: NEGATIVE

## 2022-12-27 NOTE — Progress Notes (Signed)
Contacted via [place] afternoon Marlena, your pap returned and is normal -- no abnormal findings:)

## 2022-12-30 ENCOUNTER — Other Ambulatory Visit: Payer: Self-pay | Admitting: Nurse Practitioner

## 2022-12-30 ENCOUNTER — Ambulatory Visit
Admission: RE | Admit: 2022-12-30 | Discharge: 2022-12-30 | Disposition: A | Discharge status: Home / Self Care | Payer: Medicaid Other | Source: Ambulatory Visit | Attending: Nurse Practitioner | Admitting: Nurse Practitioner

## 2022-12-30 DIAGNOSIS — Z8541 Personal history of malignant neoplasm of cervix uteri: Secondary | ICD-10-CM | POA: Diagnosis not present

## 2022-12-30 DIAGNOSIS — R102 Pelvic and perineal pain unspecified side: Secondary | ICD-10-CM

## 2022-12-30 DIAGNOSIS — Z124 Encounter for screening for malignant neoplasm of cervix: Secondary | ICD-10-CM

## 2022-12-30 NOTE — Progress Notes (Signed)
Contacted via [place] afternoon Erica Long, your pelvic ultrasound returned all nice and normal!!  Woohoo!!  Any questions?

## 2023-01-05 ENCOUNTER — Encounter: Payer: Self-pay | Admitting: Nurse Practitioner

## 2023-01-10 ENCOUNTER — Ambulatory Visit: Payer: Medicaid Other

## 2023-02-02 ENCOUNTER — Other Ambulatory Visit: Payer: Self-pay

## 2023-02-02 ENCOUNTER — Encounter: Payer: Self-pay | Admitting: Nurse Practitioner

## 2023-02-02 ENCOUNTER — Emergency Department
Admission: EM | Admit: 2023-02-02 | Discharge: 2023-02-02 | Disposition: A | Discharge status: Home / Self Care | Payer: Medicaid Other | Attending: Emergency Medicine | Admitting: Emergency Medicine

## 2023-02-02 DIAGNOSIS — R258 Other abnormal involuntary movements: Secondary | ICD-10-CM | POA: Diagnosis not present

## 2023-02-02 DIAGNOSIS — R9431 Abnormal electrocardiogram [ECG] [EKG]: Secondary | ICD-10-CM | POA: Diagnosis not present

## 2023-02-02 DIAGNOSIS — F439 Reaction to severe stress, unspecified: Secondary | ICD-10-CM | POA: Insufficient documentation

## 2023-02-02 DIAGNOSIS — R569 Unspecified convulsions: Secondary | ICD-10-CM | POA: Diagnosis not present

## 2023-02-02 DIAGNOSIS — R404 Transient alteration of awareness: Secondary | ICD-10-CM | POA: Diagnosis not present

## 2023-02-02 DIAGNOSIS — R0689 Other abnormalities of breathing: Secondary | ICD-10-CM | POA: Diagnosis not present

## 2023-02-02 LAB — CBC WITH DIFFERENTIAL/PLATELET
Abs Immature Granulocytes: 0.01 10*3/uL (ref 0.00–0.07)
Basophils Absolute: 0 10*3/uL (ref 0.0–0.1)
Basophils Relative: 1 %
Eosinophils Absolute: 0 10*3/uL (ref 0.0–0.5)
Eosinophils Relative: 0 %
HCT: 39.9 % (ref 36.0–46.0)
Hemoglobin: 13.6 g/dL (ref 12.0–15.0)
Immature Granulocytes: 0 %
Lymphocytes Relative: 27 %
Lymphs Abs: 1.2 10*3/uL (ref 0.7–4.0)
MCH: 30.4 pg (ref 26.0–34.0)
MCHC: 34.1 g/dL (ref 30.0–36.0)
MCV: 89.3 fL (ref 80.0–100.0)
Monocytes Absolute: 0.2 10*3/uL (ref 0.1–1.0)
Monocytes Relative: 5 %
Neutro Abs: 3 10*3/uL (ref 1.7–7.7)
Neutrophils Relative %: 67 %
Platelets: 274 10*3/uL (ref 150–400)
RBC: 4.47 MIL/uL (ref 3.87–5.11)
RDW: 12 % (ref 11.5–15.5)
WBC: 4.5 10*3/uL (ref 4.0–10.5)
nRBC: 0 % (ref 0.0–0.2)

## 2023-02-02 LAB — COMPREHENSIVE METABOLIC PANEL
ALT: 15 U/L (ref 0–44)
AST: 20 U/L (ref 15–41)
Albumin: 4.3 g/dL (ref 3.5–5.0)
Alkaline Phosphatase: 50 U/L (ref 38–126)
Anion gap: 10 (ref 5–15)
BUN: 12 mg/dL (ref 6–20)
CO2: 23 mmol/L (ref 22–32)
Calcium: 9 mg/dL (ref 8.9–10.3)
Chloride: 106 mmol/L (ref 98–111)
Creatinine, Ser: 0.76 mg/dL (ref 0.44–1.00)
GFR, Estimated: >60 mL/min (ref >60)
Glucose, Bld: 104 mg/dL — ABNORMAL HIGH (ref 70–99)
Potassium: 4 mmol/L (ref 3.5–5.1)
Sodium: 139 mmol/L (ref 135–145)
Total Bilirubin: 0.8 mg/dL (ref 0.3–1.2)
Total Protein: 7.2 g/dL (ref 6.5–8.1)

## 2023-02-02 LAB — URINALYSIS, ROUTINE W REFLEX MICROSCOPIC
Bilirubin Urine: NEGATIVE
Glucose, UA: NEGATIVE mg/dL
Hgb urine dipstick: NEGATIVE
Ketones, ur: NEGATIVE mg/dL
Leukocytes,Ua: NEGATIVE
Nitrite: NEGATIVE
Protein, ur: NEGATIVE mg/dL
Specific Gravity, Urine: 1.015 (ref 1.005–1.030)
pH: 7.5 (ref 5.0–8.0)

## 2023-02-02 LAB — MAGNESIUM: Magnesium: 2 mg/dL (ref 1.7–2.4)

## 2023-02-02 LAB — POC URINE PREG, ED: Preg Test, Ur: NEGATIVE

## 2023-02-02 MED ORDER — HYDROXYZINE HCL 10 MG PO TABS: 10.0000 mg | ORAL_TABLET | Freq: Three times a day (TID) | ORAL | 0 refills | Status: DC | PRN

## 2023-02-02 MED ORDER — LORAZEPAM 0.5 MG PO TABS: 0.5000 mg | ORAL_TABLET | Freq: Three times a day (TID) | ORAL | 0 refills | Status: AC | PRN

## 2023-02-02 MED ORDER — LORAZEPAM 1 MG PO TABS
1.0000 mg | ORAL_TABLET | Freq: Once | ORAL | Status: AC
  Administered 2023-02-02: 1 mg via ORAL
  Filled 2023-02-02: qty 1

## 2023-02-02 NOTE — ED Triage Notes (Signed)
Patient had unwitnessed seizure at work, states history of same but not on any medications.

## 2023-02-02 NOTE — ED Provider Notes (Signed)
[facility] Provider Note    Event Date/Time   First MD Initiated Contact with Patient 02/02/23 [phone]     (approximate)   History   Seizures   HPI  Erica Long is a 22 y.o. female  here with increasing stress and feeling she was going ot have a seizure. 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 She has been under significant increased stress recently. She states that today while at work she felt shaky, with heart racing, and anxious. She felt like she was going to have a seizure. She then "went out" in the bathroom and was shaking with generalized shaking. Sister found her "on the ground" curled up in on the ground "seizing." No other recent illness, trauma, complaints. No focal numbness or weakness.       Physical Exam   Triage Vital Signs: ED Triage Vitals [02/02/23 0959]  Enc Vitals Group     BP (!) 115/98     Pulse Rate 90     Resp 18     Temp 98.6 F (37 C)     Temp Source Oral     SpO2 99 %     Weight 129 lb (58.5 kg)     Height 5\' 3"  (1.6 m)     Head Circumference      Peak Flow      Pain Score      Pain Loc      Pain Edu?      Excl. in [place]?     Most recent vital signs: Vitals:   02/02/23 1130 02/02/23 1200  BP: 108/84 107/78  Pulse: 83 83  Resp: 13 (!) 21  Temp:    SpO2: 100% 100%     General: Awake, no distress.  CV:  Good peripheral perfusion. RRR. Resp:  Normal work of breathing. Lungs are clear to auscultation bilaterally. Abd:  No distention. No tenderness. Other:  CNII-XII intact. Strength 5/5 bl UE and LE. Normal sensation to light touch.   ED Results / Procedures / Treatments   Labs (all labs ordered are listed, but only abnormal results are displayed) Labs Reviewed  COMPREHENSIVE METABOLIC PANEL - Abnormal; Notable for the following components:      Result Value   Glucose, Bld 104 (*)    All other components within normal limits  URINALYSIS, ROUTINE W REFLEX MICROSCOPIC - Abnormal; Notable for the  following components:   Color, Urine STRAW (*)    All other components within normal limits  CBC WITH DIFFERENTIAL/PLATELET  MAGNESIUM  POC URINE PREG, ED     EKG Normal sinus rhythm, VR 87. PR 113, QRS 90, QTc 425. No acute ST elevations. No ischemia or infarct.   RADIOLOGY    I also independently reviewed and agree with radiologist interpretations.   PROCEDURES:  Critical Care performed: No   MEDICATIONS ORDERED IN ED: Medications  LORazepam (ATIVAN) tablet 1 mg (1 mg Oral Given 02/02/23 1033)     IMPRESSION / MDM / ASSESSMENT AND PLAN / ED COURSE  I reviewed the triage vital signs and the nursing notes.                              Differential diagnosis includes, but is not limited to, PNES, anxiety, palpitations, anemia, dehydration, SVT/arrhythmia, electrolyte abnormality.  Patient's presentation is most consistent with acute presentation with potential threat to life or bodily function.  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 This occurs in setting of multiple stressors. She is calm, pleasant, cooperative here. No focal neuro deficits. No recent head trauma.   Lab work is very reassuring. CBC shows no leukocytosis or anemia. UA negative. UPT negative. CMP normal. Mag normal. Feels better with ativan here. No focal deficits.  Suspect this is related to recent increased stress. I do think she would benefit from having a PCP/psychiatrist, will refer for f/u. Will trial brief course of anxiolytic/antiepileptic benzo to bridge to f/u.  FINAL CLINICAL IMPRESSION(S) / ED DIAGNOSES   Final diagnoses:  Seizure-like activity ([place])     Rx / DC Orders   ED Discharge Orders          Ordered    LORazepam (ATIVAN) 0.5 MG tablet  Every 8 hours PRN         02/02/23 1341    hydrOXYzine (ATARAX) 10 MG tablet  3 times daily PRN        02/02/23 1341             Note:  This document was prepared using Dragon voice recognition software and may include unintentional dictation errors.   Duffy Bruce, MD 02/02/23 2222

## 2023-02-02 NOTE — Discharge Instructions (Signed)
Please go to the following website to schedule new (and existing) patient appointments:   [email or web address]   The following is a list of primary care offices in the area who are accepting new patients at this time.  Please reach out to one of them directly and let them know you would like to schedule an appointment to follow up on an Emergency Department visit, and/or to establish a new primary care provider (PCP).  There are likely other primary care clinics in the are who are accepting new patients, but this is an excellent place to start:  [facility] Lead physician: Dr Angela Bacigalupo [address] [place], [place] [address] [phone]  [facility] Lead Physician: Dr Krichna Sowles [address], [place], [place] [address] [phone]  [facility]  Lead Physician: Dr Megan Johnson [address], [place], [place] [address] [phone]  [facility] Lead Physician: Dr Alex Karamalegos [address], [place], [place] [address] [phone]  [facility] [place] Lead Physician: Dr Laura Berglund [address], [place], [place] [address] [phone]   

## 2023-02-03 NOTE — Telephone Encounter (Signed)
Called the pt and she stated that she was told that she could be scheduled for Monday 02/06/2023 at 11:20 spoke with my colleagues and no one had spoke with her. I let the pt know that I will speak with the provider and give her a call back.

## 2023-02-03 NOTE — Telephone Encounter (Signed)
Pt was called and she has been scheduled.  Pt also stated that she has some concerns about her new medication as far as how to take it.  She stated that she will send a [employer].

## 2023-02-04 NOTE — Patient Instructions (Signed)
Seizure, Adult A seizure is a sudden burst of abnormal electrical and chemical activity in the brain. Seizures usually last from 30 seconds to 2 minutes.  What are the causes? Common causes of this condition include: Fever or infection. Problems that affect the brain. These may include: A brain or head injury. Bleeding in the brain. A brain tumor. Low levels of blood sugar or salt. Kidney problems or liver problems. Conditions that are passed from parent to child (are inherited). Problems with a substance, such as: Having a reaction to a drug or a medicine. Stopping the use of a substance all of a sudden (withdrawal). A stroke. Disorders that affect how you develop. Sometimes, the cause may not be known.  What increases the risk? Having someone in your family who has epilepsy. In this condition, seizures happen again and again over time. They have no clear cause. Having had a tonic-clonic seizure before. This type of seizure causes you to: Tighten the muscles of the whole body. Lose consciousness. Having had a head injury or strokes before. Having had a lack of oxygen at birth. What are the signs or symptoms? There are many types of seizures. The symptoms vary depending on the type of seizure you have. Symptoms during a seizure Shaking that you cannot control (convulsions) with fast, jerky movements of muscles. Stiffness of the body. Breathing problems. Feeling mixed up (confused). Staring or not responding to sound or touch. Head nodding. Eyes that blink, flutter, or move fast. Drooling, grunting, or making clicking sounds with your mouth Losing control of when you pee or poop. Symptoms before a seizure Feeling afraid, nervous, or worried. Feeling like you may vomit. Feeling like: You are moving when you are not. Things around you are moving when they are not. Feeling like you saw or heard something before (dj vu). Odd tastes or smells. Changes in how you see. You may  see flashing lights or spots. Symptoms after a seizure Feeling confused. Feeling sleepy. Headache. Sore muscles. How is this treated? If your seizure stops on its own, you will not need treatment. If your seizure lasts longer than 5 minutes, you will normally need treatment. Treatment may include: Medicines given through an IV tube. Avoiding things, such as medicines, that are known to cause your seizures. Medicines to prevent seizures. A device to prevent or control seizures. Surgery. A diet low in carbohydrates and high in fat (ketogenic diet). Follow these instructions at home: Medicines Take over-the-counter and prescription medicines only as told by your doctor. Avoid foods or drinks that may keep your medicine from working, such as alcohol. Activity Follow instructions about driving, swimming, or doing things that would be dangerous if you had another seizure. Wait until your doctor says it is safe for you to do these things. If you live in the [place] ask your local department of motor vehicles when you can drive. Get a lot of rest. Teaching others  Teach friends and family what to do when you have a seizure. They should: Help you get down to the ground. Protect your head and body. Loosen any clothing around your neck. Turn you on your side. Know whether or not you need emergency care. Stay with you until you are better. Also, tell them what not to do if you have a seizure. Tell them: They should not hold you down. They should not put anything in your mouth. General instructions Avoid anything that gives you seizures. Keep a seizure diary. Write down: What you remember   about each seizure. What you think caused each seizure. Keep all follow-up visits. Contact a doctor if: You have another seizure or seizures. Call the doctor each time you have a seizure. The pattern of your seizures changes. You keep having seizures with treatment. You have symptoms of being sick or  having an infection. You are not able to take your medicine. Get help right away if: You have any of these problems: A seizure that lasts longer than 5 minutes. Many seizures in a row and you do not feel better between seizures. A seizure that makes it harder to breathe. A seizure and you can no longer speak or use part of your body. You do not wake up right after a seizure. You get hurt during a seizure. You feel confused or have pain right after a seizure. These symptoms may be an emergency. Get help right away. Call your local emergency services (911 in the [place] Do not wait to see if the symptoms will go away. Do not drive yourself to the hospital. Summary A seizure is a sudden burst of abnormal electrical and chemical activity in the brain. Seizures normally last from 30 seconds to 2 minutes. Causes of seizures include illness, injury to the head, low levels of blood sugar or salt, and certain conditions. Most seizures will stop on their own in less than 5 minutes. Seizures that last longer than 5 minutes are a medical emergency and need treatment right away. Many medicines are used to treat seizures. Take over-the-counter and prescription medicines only as told by your doctor. This information is not intended to replace advice given to you by your health care provider. Make sure you discuss any questions you have with your health care provider. Document Revised: 05/08/2020 Document Reviewed: 05/08/2020 Elsevier Patient Education  2023 Elsevier Inc.  

## 2023-02-06 ENCOUNTER — Telehealth: Payer: Self-pay | Admitting: Nurse Practitioner

## 2023-02-06 ENCOUNTER — Encounter: Payer: Self-pay | Admitting: Nurse Practitioner

## 2023-02-06 ENCOUNTER — Ambulatory Visit (INDEPENDENT_AMBULATORY_CARE_PROVIDER_SITE_OTHER): Payer: Medicaid Other | Admitting: Nurse Practitioner

## 2023-02-06 VITALS — BP 106/71 | HR 96 | Temp 98.2°F | Ht 65.0 in | Wt 127.1 lb

## 2023-02-06 DIAGNOSIS — F319 Bipolar disorder, unspecified: Secondary | ICD-10-CM | POA: Diagnosis not present

## 2023-02-06 DIAGNOSIS — F445 Conversion disorder with seizures or convulsions: Secondary | ICD-10-CM | POA: Insufficient documentation

## 2023-02-06 DIAGNOSIS — R569 Unspecified convulsions: Secondary | ICD-10-CM | POA: Insufficient documentation

## 2023-02-06 MED ORDER — DIVALPROEX SODIUM 250 MG PO DR TAB: 250.0000 mg | DELAYED_RELEASE_TABLET | Freq: Two times a day (BID) | ORAL | 4 refills | Status: DC

## 2023-02-06 MED ORDER — BUSPIRONE HCL 10 MG PO TABS: 10.0000 mg | ORAL_TABLET | Freq: Two times a day (BID) | ORAL | 4 refills | Status: DC

## 2023-02-06 MED ORDER — HYDROXYZINE HCL 10 MG PO TABS: 10.0000 mg | ORAL_TABLET | Freq: Three times a day (TID) | ORAL | 0 refills | Status: DC | PRN

## 2023-02-06 NOTE — Telephone Encounter (Signed)
Pt came in and dropped off [facility] form that needs Section 4 to be completed by the provider.  Upon the completion of the form the pt would like to be called at the number on file to pick the form up.  Place in the providers folder in the back to be completed.

## 2023-02-06 NOTE — Assessment & Plan Note (Signed)
Has history of similar during periods of poor control anxiety.  Will get back into neurology for further assessment, previous work-up was reassuring.  Refer to Bipolar plan for further.  Discussed with patient and advise no return to work until further work-up by psychiatry and neurology, as work appears to exacerbate symptoms.

## 2023-02-06 NOTE — Assessment & Plan Note (Signed)
Chronic, exacerbated by work stressors with more anxiety. Denies SI/HI. PHQ 9 = 17 and GAD 7 = 13.  At this time will restart Depakote 250 MG BID which may benefit mood and seizure-like events + restart Buspar at 10 MG BID which she has tolerated in past.  Referral to psychiatry to re establish care, this would be beneficial and recommend she continue ongoing care with them once established.  Discussed with her at length and educated.  Has safety plan in place if suicidal ideation were to present, is aware to immediately go to ER if intrusive thoughts. Return in 4 weeks.

## 2023-02-06 NOTE — Progress Notes (Signed)
BP 106/71   Pulse 96   Temp 98.2 F (36.8 C) (Oral)   Ht 5\' 5"  (1.651 m)   Wt 127 lb 1.6 oz (57.7 kg)   SpO2 98%   BMI 21.15 kg/m    Subjective:    Patient ID: Erica Long, female    DOB: [birth year], 22 y.o.   MRN: [id number]  HPI: Erica Long is a 22 y.o. female  Chief Complaint  Patient presents with   ER follow up    Was in ER on 02/02/23 for seizure.    ER FOLLOW UP Was seen in ER on 02/02/23 for seizure-like activity, was under a lot of stress at the time.  Work related stressors.  Was at her Nana's house, when her teeth started to chatter and had seizure-like event took place.  Has history of similar and was seen by neurology in past, last visit 02/09/21 with overall reassuring testing.  Was diagnosed at time with psychogenic hyperventilation.    Was given Ativan in ER which offered benefit.  Has underlying Bipolar, previously followed by psychiatry -- took Depakote, Trileptal, Seroquel, Lamictal (caused hives), Buspar, Trazodone.  Has not seen psychiatry since December 2022.  January 2023 was last time she had seizure as had to cold [place] medication since pharmacy could not get in touch with psychiatry.  Does endorse alcohol use every night, 2 drinks (beer or wine).  Occasional MJ use, every couple days.  Uses nicotine at times.  Has had no further events since Thursday, has not been at work since then either.  Works at [employer]. Time since discharge:  Hospital/facility: [facility] Diagnosis: seizure-like activity Procedures/tests: blood work reassuring - no imaging done Consultants: none New medications: none Discharge instructions:  Follow-up with PCP Status: stable     12/21/2022    8:48 AM 10/19/2022    1:35 PM 06/29/2021    4:11 PM 10/13/2020    4:40 PM 06/23/2020    2:54 PM  Depression screen PHQ 2/9  Decreased Interest 1 0 0 2 3  Down, Depressed, Hopeless 3 0 0 1 1  PHQ - 2 Score 4 0 0 3 4  Altered sleeping 2 0 0 2 2  Tired, decreased  energy 2 1 1 2 3   Change in appetite 2 0 2 3 3   Feeling bad or failure about yourself  3 0 0 1 0  Trouble concentrating 1 1 0 1 1  Moving slowly or fidgety/restless 2 1 0 0 2  Suicidal thoughts 1 0 0 0 0  PHQ-9 Score 17 3 3 12 15   Difficult doing work/chores Somewhat difficult Not difficult at all Not difficult at all Somewhat difficult Somewhat difficult       12/21/2022    8:48 AM 10/19/2022    1:35 PM 06/29/2021    4:10 PM 10/13/2020    4:40 PM  GAD 7 : Generalized Anxiety Score  Nervous, Anxious, on Edge 2 1 2 3   Control/stop worrying 3 2 1 3   Worry too much - different things 3 2 1 3   Trouble relaxing 1 1 0 2  Restless 1 0 0 1  Easily annoyed or irritable 2 2 2 3   Afraid - awful might happen 1 0 0 1  Total GAD 7 Score 13 8 6 16   Anxiety Difficulty Somewhat difficult Not difficult at all Not difficult at all Somewhat difficult   Relevant past medical, surgical, family and social history reviewed and updated as  indicated. Interim medical history since our last visit reviewed. Allergies and medications reviewed and updated.  Review of Systems  Constitutional:  Negative for activity change, appetite change, diaphoresis, fatigue and fever.  Respiratory:  Negative for cough, chest tightness and shortness of breath.   Cardiovascular:  Negative for chest pain, palpitations and leg swelling.  Gastrointestinal: Negative.   Neurological:  Positive for seizures. Negative for dizziness, syncope, weakness, light-headedness, numbness and headaches.  Psychiatric/Behavioral:  Positive for sleep disturbance. Negative for decreased concentration, self-injury and suicidal ideas. The patient is nervous/anxious.     Per HPI unless specifically indicated above     Objective:    BP 106/71   Pulse 96   Temp 98.2 F (36.8 C) (Oral)   Ht 5\' 5"  (1.651 m)   Wt 127 lb 1.6 oz (57.7 kg)   SpO2 98%   BMI 21.15 kg/m   Wt Readings from Last 3 Encounters:  02/06/23 127 lb 1.6 oz (57.7 kg)   02/02/23 129 lb (58.5 kg)  12/21/22 124 lb 9.6 oz (56.5 kg)    Physical Exam Vitals and nursing note reviewed.  Constitutional:      General: She is awake. She is not in acute distress.    Appearance: She is well-developed and well-groomed. She is not ill-appearing or toxic-appearing.  HENT:     Head: Normocephalic.     Right Ear: Hearing and external ear normal.     Left Ear: Hearing and external ear normal.  Eyes:     General: Lids are normal.        Right eye: No discharge.        Left eye: No discharge.     Extraocular Movements: Extraocular movements intact.     Conjunctiva/sclera: Conjunctivae normal.     Pupils: Pupils are equal, round, and reactive to light.     Visual Fields: Right eye visual fields normal and left eye visual fields normal.  Neck:     Thyroid: No thyromegaly.     Vascular: No carotid bruit.  Cardiovascular:     Rate and Rhythm: Normal rate and regular rhythm.     Heart sounds: Normal heart sounds. No murmur heard.    No gallop.  Pulmonary:     Effort: Pulmonary effort is normal. No accessory muscle usage or respiratory distress.     Breath sounds: Normal breath sounds.  Abdominal:     General: Bowel sounds are normal.     Palpations: Abdomen is soft. There is no hepatomegaly or splenomegaly.  Musculoskeletal:     Cervical back: Normal range of motion and neck supple.     Right lower leg: No edema.     Left lower leg: No edema.  Lymphadenopathy:     Cervical: No cervical adenopathy.  Skin:    General: Skin is warm and dry.  Neurological:     Mental Status: She is alert and oriented to person, place, and time.     Cranial Nerves: Cranial nerves 2-12 are intact.     Motor: Motor function is intact.     Coordination: Coordination is intact.     Gait: Gait is intact.     Deep Tendon Reflexes: Reflexes are normal and symmetric.     Reflex Scores:      Brachioradialis reflexes are 2+ on the right side and 2+ on the left side.      Patellar  reflexes are 2+ on the right side and 2+ on the left side. Psychiatric:  Attention and Perception: Attention normal.        Mood and Affect: Mood normal.        Speech: Speech normal.        Behavior: Behavior normal. Behavior is cooperative.        Thought Content: Thought content normal.     Results for orders placed or performed during the hospital encounter of 02/02/23  CBC with Differential  Result Value Ref Range   WBC 4.5 4.0 - 10.5 K/uL   RBC 4.47 3.87 - 5.11 MIL/uL   Hemoglobin 13.6 12.0 - 15.0 g/dL   HCT 39.9 36.0 - 46.0 %   MCV 89.3 80.0 - 100.0 fL   MCH 30.4 26.0 - 34.0 pg   MCHC 34.1 30.0 - 36.0 g/dL   RDW 12.0 11.5 - 15.5 %   Platelets 274 150 - 400 K/uL   nRBC 0.0 0.0 - 0.2 %   Neutrophils Relative % 67 %   Neutro Abs 3.0 1.7 - 7.7 K/uL   Lymphocytes Relative 27 %   Lymphs Abs 1.2 0.7 - 4.0 K/uL   Monocytes Relative 5 %   Monocytes Absolute 0.2 0.1 - 1.0 K/uL   Eosinophils Relative 0 %   Eosinophils Absolute 0.0 0.0 - 0.5 K/uL   Basophils Relative 1 %   Basophils Absolute 0.0 0.0 - 0.1 K/uL   Immature Granulocytes 0 %   Abs Immature Granulocytes 0.01 0.00 - 0.07 K/uL  Comprehensive metabolic panel  Result Value Ref Range   Sodium 139 135 - 145 mmol/L   Potassium 4.0 3.5 - 5.1 mmol/L   Chloride 106 98 - 111 mmol/L   CO2 23 22 - 32 mmol/L   Glucose, Bld 104 (H) 70 - 99 mg/dL   BUN 12 6 - 20 mg/dL   Creatinine, Ser 0.76 0.44 - 1.00 mg/dL   Calcium 9.0 8.9 - 10.3 mg/dL   Total Protein 7.2 6.5 - 8.1 g/dL   Albumin 4.3 3.5 - 5.0 g/dL   AST 20 15 - 41 U/L   ALT 15 0 - 44 U/L   Alkaline Phosphatase 50 38 - 126 U/L   Total Bilirubin 0.8 0.3 - 1.2 mg/dL   GFR, Estimated >60 >60 mL/min   Anion gap 10 5 - 15  Magnesium  Result Value Ref Range   Magnesium 2.0 1.7 - 2.4 mg/dL  Urinalysis, Routine w reflex microscopic -Urine, Clean Catch  Result Value Ref Range   Color, Urine STRAW (A) YELLOW   APPearance CLEAR CLEAR   Specific Gravity, Urine 1.015  1.005 - 1.030   pH 7.5 5.0 - 8.0   Glucose, UA NEGATIVE NEGATIVE mg/dL   Hgb urine dipstick NEGATIVE NEGATIVE   Bilirubin Urine NEGATIVE NEGATIVE   Ketones, ur NEGATIVE NEGATIVE mg/dL   Protein, ur NEGATIVE NEGATIVE mg/dL   Nitrite NEGATIVE NEGATIVE   Leukocytes,Ua NEGATIVE NEGATIVE  POC Urine Pregnancy, ED  Result Value Ref Range   Preg Test, Ur NEGATIVE NEGATIVE      Assessment & Plan:   Problem List Items Addressed This Visit       Other   Bipolar I disorder (HCC)    Chronic, exacerbated by work stressors with more anxiety. Denies SI/HI. PHQ 9 = 17 and GAD 7 = 13.  At this time will restart Depakote 250 MG BID which may benefit mood and seizure-like events + restart Buspar at 10 MG BID which she has tolerated in past.  Referral to psychiatry to re establish care, this  would be beneficial and recommend she continue ongoing care with them once established.  Discussed with her at length and educated.  Has safety plan in place if suicidal ideation were to present, is aware to immediately go to ER if intrusive thoughts. Return in 4 weeks.      Relevant Orders   Ambulatory referral to Neurology   Ambulatory referral to Psychiatry   Seizure-like activity [facility]) - Primary    Has history of similar during periods of poor control anxiety.  Will get back into neurology for further assessment, previous work-up was reassuring.  Refer to Bipolar plan for further.  Discussed with patient and advise no return to work until further work-up by psychiatry and neurology, as work appears to exacerbate symptoms.      Relevant Orders   Ambulatory referral to Neurology     Follow up plan: Return in about 4 weeks (around 03/06/2023) for Seizure-like activity and mood.

## 2023-02-07 NOTE — Telephone Encounter (Signed)
Paperwork was placed in PCP folder for signature

## 2023-02-08 NOTE — Telephone Encounter (Signed)
Patient was called and informed that paperwork was signed and ready for pick up. Patient verbalized understanding

## 2023-02-21 DIAGNOSIS — F3181 Bipolar II disorder: Secondary | ICD-10-CM | POA: Diagnosis not present

## 2023-02-21 DIAGNOSIS — F410 Panic disorder [episodic paroxysmal anxiety] without agoraphobia: Secondary | ICD-10-CM | POA: Diagnosis not present

## 2023-02-22 ENCOUNTER — Telehealth: Payer: Self-pay | Admitting: Nurse Practitioner

## 2023-02-22 DIAGNOSIS — F410 Panic disorder [episodic paroxysmal anxiety] without agoraphobia: Secondary | ICD-10-CM | POA: Insufficient documentation

## 2023-02-22 NOTE — Telephone Encounter (Signed)
Copied from CRM [id number]. Topic: Medical Record Request - Other >> Feb 22, 2023  3:31 PM Marlow Baars wrote: Reason for CRM: The patient called to let her provider know that [employer] [employer] [facility] [employer] her disability company will be faxing over a request to obtain some medical records on her behalf. She said they mainly just need the notes from her 03/25 visit. She sent them a screenshot of her after visit summary but they said they needed more. Please assist patient further

## 2023-02-28 ENCOUNTER — Other Ambulatory Visit: Payer: Self-pay | Admitting: Nurse Practitioner

## 2023-03-01 NOTE — Telephone Encounter (Signed)
Requested Prescriptions  Pending Prescriptions Disp Refills   busPIRone (BUSPAR) 10 MG tablet [Pharmacy Med Name: BUSPIRONE HCL 10 MG TABLET] 180 tablet 2    Sig: TAKE 1 TABLET BY MOUTH TWICE A DAY     Psychiatry: Anxiolytics/Hypnotics - Non-controlled Passed - 02/28/2023 10:32 AM      Passed - Valid encounter within last 12 months    Recent Outpatient Visits           3 weeks ago Seizure-like activity (HCC)   [place] [facility] [place], [place] T, NP   2 months ago Pelvic pain   [place] [facility] [place], [place] T, NP   3 months ago Sore throat   [place] [facility] [place], [place] T, NP   4 months ago Bipolar I disorder (HCC)   [place] [facility] [place], Corrie Dandy T, NP   1 year ago Bipolar I disorder (HCC)   [place] [facility] [place], Corrie Dandy T, NP       Future Appointments             In 1 week [place], Dorie Rank, NP [place] [facility]   In 7 months [place], [place] T, NP [place] [facility]             divalproex (DEPAKOTE) 250 MG DR tablet [Pharmacy Med Name: DIVALPROEX SOD DR 250 MG TAB] 180 tablet 2    Sig: TAKE 1 TABLET BY MOUTH TWICE A DAY     Neurology:  Anticonvulsants - Valproates Failed - 02/28/2023 10:32 AM      Failed - Valproic Acid (serum) in normal range and within 360 days    Valproic Acid Lvl  Date Value Ref Range Status  11/20/2018 63 50 - 100 ug/mL Final    Comment:                                    Detection Limit = 4                            <4 indicates None Detected Toxicity may occur at levels of 100-500. Measurements of free unbound valproic acid may improve the assess- ment of clinical response.          Passed - AST in normal range and within 360 days    AST  Date Value Ref Range Status  02/02/2023 20 15 - 41 U/L Final         Passed - ALT in normal range and within 360 days    ALT  Date Value Ref  Range Status  02/02/2023 15 0 - 44 U/L Final         Passed - HGB in normal range and within 360 days    Hemoglobin  Date Value Ref Range Status  02/02/2023 13.6 12.0 - 15.0 g/dL Final  11/16/7251 66.4 11.1 - 15.9 g/dL Final         Passed - PLT in normal range and within 360 days    Platelets  Date Value Ref Range Status  02/02/2023 274 150 - 400 K/uL Final  10/19/2022 293 150 - 450 x10E3/uL Final         Passed - WBC in normal range and within 360 days    WBC  Date Value Ref Range Status  02/02/2023  4.5 4.0 - 10.5 K/uL Final         Passed - HCT in normal range and within 360 days    HCT  Date Value Ref Range Status  02/02/2023 39.9 36.0 - 46.0 % Final   Hematocrit  Date Value Ref Range Status  10/19/2022 40.6 34.0 - 46.6 % Final         Passed - Completed PHQ-2 or PHQ-9 in the last 360 days      Passed - Patient is not pregnant      Passed - Valid encounter within last 12 months    Recent Outpatient Visits           3 weeks ago Seizure-like activity (HCC)   [place] [facility] [place], [place] T, NP   2 months ago Pelvic pain   [place] [facility] [place], [place] T, NP   3 months ago Sore throat   [place] [facility] [place], [place] T, NP   4 months ago Bipolar I disorder (HCC)   [place] [facility] [place], Corrie Dandy T, NP   1 year ago Bipolar I disorder (HCC)   [place] [facility] [place], Dorie Rank, NP       Future Appointments             In 1 week Cann[place], Dorie Rank, NP [place] [facility]   In 7 months [place], Dorie Rank, NP  [facility]

## 2023-03-05 NOTE — Patient Instructions (Signed)
Managing Anxiety, Adult After being diagnosed with anxiety, you may be relieved to know why you have felt or behaved a certain way. You may also feel overwhelmed about the treatment ahead and what it will mean for your life. With care and support, you can manage your anxiety. How to manage lifestyle changes Understanding the difference between stress and anxiety Although stress can play a role in anxiety, it is not the same as anxiety. Stress is your body's reaction to life changes and events, both good and bad. Stress is often caused by something external, such as a deadline, test, or competition. It normally goes away after the event has ended and will last just a few hours. But, stress can be ongoing and can lead to more than just stress. Anxiety is caused by something internal, such as imagining a terrible outcome or worrying that something will go wrong that will greatly upset you. Anxiety often does not go away even after the event is over, and it can become a long-term (chronic) worry. Lowering stress and anxiety Talk with your health care provider or a counselor to learn more about lowering anxiety and stress. They may suggest tension-reduction techniques, such as: Music. Spend time creating or listening to music that you enjoy and that inspires you. Mindfulness-based meditation. Practice being aware of your normal breaths while not trying to control your breathing. It can be done while sitting or walking. Centering prayer. Focus on a word, phrase, or sacred image that means something to you and brings you peace. Deep breathing. Expand your stomach and inhale slowly through your nose. Hold your breath for 3-5 seconds. Then breathe out slowly, letting your stomach muscles relax. Self-talk. Learn to notice and spot thought patterns that lead to anxiety reactions. Change those patterns to thoughts that feel peaceful. Muscle relaxation. Take time to tense muscles and then relax them. Choose a  tension-reduction technique that fits your lifestyle and personality. These techniques take time and practice. Set aside 5-15 minutes a day to do them. Specialized therapists can offer counseling and training in these techniques. The training to help with anxiety may be covered by some insurance plans. Other things you can do to manage stress and anxiety include: Keeping a stress diary. This can help you learn what triggers your reaction and then learn ways to manage your response. Thinking about how you react to certain situations. You may not be able to control everything, but you can control your response. Making time for activities that help you relax and not feeling guilty about spending your time in this way. Doing visual imagery. This involves imagining or creating mental pictures to help you relax. Practicing yoga. Through yoga poses, you can lower tension and relax.  Medicines Medicines for anxiety include: Antidepressant medicines. These are usually prescribed for long-term daily control. Anti-anxiety medicines. These may be added in severe cases, especially when panic attacks occur. When used together, medicines, psychotherapy, and tension-reduction techniques may be the most effective treatment. Relationships Relationships can play a big part in helping you recover. Spend more time connecting with trusted friends and family members. Think about going to couples counseling if you have a partner, taking family education classes, or going to family therapy. Therapy can help you and others better understand your anxiety. How to recognize changes in your anxiety Everyone responds differently to treatment for anxiety. Recovery from anxiety happens when symptoms lessen and stop interfering with your daily life at home or work. This may mean that you   will start to: Have better concentration and focus. Worry will interfere less in your daily thinking. Sleep better. Be less irritable. Have more  energy. Have improved memory. Try to recognize when your condition is getting worse. Contact your provider if your symptoms interfere with home or work and you feel like your condition is not improving. Follow these instructions at home: Activity Exercise. Adults should: Exercise for at least 150 minutes each week. The exercise should increase your heart rate and make you sweat (moderate-intensity exercise). Do strengthening exercises at least twice a week. Get the right amount and quality of sleep. Most adults need 7-9 hours of sleep each night. Lifestyle  Eat a healthy diet that includes plenty of vegetables, fruits, whole grains, low-fat dairy products, and lean protein. Do not eat a lot of foods that are high in fats, added sugars, or salt (sodium). Make choices that simplify your life. Do not use any products that contain nicotine or tobacco. These products include cigarettes, chewing tobacco, and vaping devices, such as e-cigarettes. If you need help quitting, ask your provider. Avoid caffeine, alcohol, and certain over-the-counter cold medicines. These may make you feel worse. Ask your pharmacist which medicines to avoid. General instructions Take over-the-counter and prescription medicines only as told by your provider. Keep all follow-up visits. This is to make sure you are managing your anxiety well or if you need more support. Where to find support You can get help and support from: Self-help groups. Online and community organizations. A trusted spiritual leader. Couples counseling. Family education classes. Family therapy. Where to find more information You may find that joining a support group helps you deal with your anxiety. The following sources can help you find counselors or support groups near you: Mental Health America: mentalhealthamerica.net Anxiety and Depression Association of America (ADAA): adaa.org National Alliance on Mental Illness (NAMI): nami.org Contact  a health care provider if: You have a hard time staying focused or finishing tasks. You spend many hours a day feeling worried about everyday life. You are very tired because you cannot stop worrying. You start to have headaches or often feel tense. You have chronic nausea or diarrhea. Get help right away if: Your heart feels like it is racing. You have shortness of breath. You have thoughts of hurting yourself or others. Get help right away if you feel like you may hurt yourself or others, or have thoughts about taking your own life. Go to your nearest emergency room or: Call 911. Call the National Suicide Prevention Lifeline at 1-800-273-8255 or 988. This is open 24 hours a day. Text the Crisis Text Line at 741741. This information is not intended to replace advice given to you by your health care provider. Make sure you discuss any questions you have with your health care provider. Document Revised: 08/09/2022 Document Reviewed: 02/21/2021 Elsevier Patient Education  2023 Elsevier Inc.  

## 2023-03-07 DIAGNOSIS — F3181 Bipolar II disorder: Secondary | ICD-10-CM | POA: Diagnosis not present

## 2023-03-07 DIAGNOSIS — F410 Panic disorder [episodic paroxysmal anxiety] without agoraphobia: Secondary | ICD-10-CM | POA: Diagnosis not present

## 2023-03-09 ENCOUNTER — Encounter: Payer: Self-pay | Admitting: Nurse Practitioner

## 2023-03-09 ENCOUNTER — Ambulatory Visit: Payer: Medicaid Other | Admitting: Nurse Practitioner

## 2023-03-09 VITALS — BP 111/72 | HR 72 | Temp 98.0°F | Ht 65.0 in | Wt 131.2 lb

## 2023-03-09 DIAGNOSIS — F3181 Bipolar II disorder: Secondary | ICD-10-CM

## 2023-03-09 DIAGNOSIS — R569 Unspecified convulsions: Secondary | ICD-10-CM | POA: Diagnosis not present

## 2023-03-09 NOTE — Assessment & Plan Note (Signed)
No further events, will continue to work with psychiatry.  Appreciate their input.  Had negative neurology work-up in past.

## 2023-03-09 NOTE — Assessment & Plan Note (Signed)
Chronic, ongoing.  Followed by psychiatry.  Denies SI/HI. Continue medication regimen as prescribed by them and attempt to get notes. Discussed with her at length and educated.  Has safety plan in place if suicidal ideation were to present, is aware to immediately go to ER if intrusive thoughts. Return  for physical in December.

## 2023-03-09 NOTE — Progress Notes (Signed)
BP 111/72   Pulse 72   Temp 98 F (36.7 C) (Oral)   Ht 5\' 5"  (1.651 m)   Wt 131 lb 3.2 oz (59.5 kg)   SpO2 99%   BMI 21.83 kg/m    Subjective:    Patient ID: Erica Long, female    DOB: 11-06-2001, 22 y.o.   MRN: [id number]  HPI: Erica Long is a 22 y.o. female  Chief Complaint  Patient presents with   Seizure like activity    Psych has changed her meds 3 times in the last 2 weeks    BIPOLAR 2 DISORDER Follow-up today for mood and recent seizure activity, placed referral to neurology last visit but they declined as patient had a negative work-up in past and they recommended psychiatry.  She is currently following with psychiatry, who has been adjusting medications -- following with [facility].  Currently taking Vraylar 1.5 MG daily, Ativan PRN, Atarax PRN, and Buspar 10 MG BID.  Have taken her off Depakote due to light headedness in morning, however she is still having this and thinking it is the Buspar.  She will discuss with them.  Has visit with them next May 7th.   Mood status: working with psychiatry Satisfied with current treatment?: yes Symptom severity: moderate  Duration of current treatment : chronic Side effects: yes as above Medication compliance: good compliance Psychotherapy/counseling: CBT therapy in future Previous psychiatric medications: multiple medications Depressed mood: yes Anxious mood: yes Anhedonia: yes Significant weight loss or gain: no Insomnia: at times and then at times over sleeping Fatigue: yes Feelings of worthlessness or guilt: yes Impaired concentration/indecisiveness:  occasional Suicidal ideations: no -- not recently, in past Hopelessness: yes Crying spells: yes    03/09/2023    9:54 AM 12/21/2022    8:48 AM 10/19/2022    1:35 PM 06/29/2021    4:11 PM 10/13/2020    4:40 PM  Depression screen PHQ 2/9  Decreased Interest 3 1 0 0 2  Down, Depressed, Hopeless 3 3 0 0 1  PHQ - 2 Score 6 4 0 0 3  Altered sleeping 3 2 0 0 2   Tired, decreased energy 2 2 1 1 2   Change in appetite 2 2 0 2 3  Feeling bad or failure about yourself  3 3 0 0 1  Trouble concentrating 2 1 1  0 1  Moving slowly or fidgety/restless 2 2 1  0 0  Suicidal thoughts 1 1 0 0 0  PHQ-9 Score 21 17 3 3 12   Difficult doing work/chores Extremely dIfficult Somewhat difficult Not difficult at all Not difficult at all Somewhat difficult       03/09/2023    9:54 AM 12/21/2022    8:48 AM 10/19/2022    1:35 PM 06/29/2021    4:10 PM  GAD 7 : Generalized Anxiety Score  Nervous, Anxious, on Edge 3 2 1 2   Control/stop worrying 3 3 2 1   Worry too much - different things 3 3 2 1   Trouble relaxing 2 1 1  0  Restless 1 1 0 0  Easily annoyed or irritable 2 2 2 2   Afraid - awful might happen 2 1 0 0  Total GAD 7 Score 16 13 8 6   Anxiety Difficulty Very difficult Somewhat difficult Not difficult at all Not difficult at all    Relevant past medical, surgical, family and social history reviewed and updated as indicated. Interim medical history since our last visit reviewed. Allergies and medications  reviewed and updated.  Review of Systems  Constitutional:  Negative for activity change, appetite change, diaphoresis, fatigue and fever.  Respiratory:  Negative for cough, chest tightness and shortness of breath.   Cardiovascular:  Negative for chest pain, palpitations and leg swelling.  Gastrointestinal: Negative.   Neurological: Negative.   Psychiatric/Behavioral:  Positive for sleep disturbance. Negative for decreased concentration, self-injury and suicidal ideas. The patient is nervous/anxious.    Per HPI unless specifically indicated above     Objective:    BP 111/72   Pulse 72   Temp 98 F (36.7 C) (Oral)   Ht 5\' 5"  (1.651 m)   Wt 131 lb 3.2 oz (59.5 kg)   SpO2 99%   BMI 21.83 kg/m   Wt Readings from Last 3 Encounters:  03/09/23 131 lb 3.2 oz (59.5 kg)  02/06/23 127 lb 1.6 oz (57.7 kg)  02/02/23 129 lb (58.5 kg)    Physical Exam Vitals and  nursing note reviewed.  Constitutional:      General: She is awake. She is not in acute distress.    Appearance: She is well-developed and well-groomed. She is not ill-appearing or toxic-appearing.  HENT:     Head: Normocephalic.     Right Ear: Hearing and external ear normal.     Left Ear: Hearing and external ear normal.  Eyes:     General: Lids are normal.        Right eye: No discharge.        Left eye: No discharge.     Extraocular Movements: Extraocular movements intact.     Conjunctiva/sclera: Conjunctivae normal.     Pupils: Pupils are equal, round, and reactive to light.     Visual Fields: Right eye visual fields normal and left eye visual fields normal.  Neck:     Thyroid: No thyromegaly.     Vascular: No carotid bruit.  Cardiovascular:     Rate and Rhythm: Normal rate and regular rhythm.     Heart sounds: Normal heart sounds. No murmur heard.    No gallop.  Pulmonary:     Effort: Pulmonary effort is normal. No accessory muscle usage or respiratory distress.     Breath sounds: Normal breath sounds.  Abdominal:     General: Bowel sounds are normal.     Palpations: Abdomen is soft. There is no hepatomegaly or splenomegaly.  Musculoskeletal:     Cervical back: Normal range of motion and neck supple.     Right lower leg: No edema.     Left lower leg: No edema.  Lymphadenopathy:     Cervical: No cervical adenopathy.  Skin:    General: Skin is warm and dry.  Neurological:     Mental Status: She is alert and oriented to person, place, and time.     Cranial Nerves: Cranial nerves 2-12 are intact.     Motor: Motor function is intact.     Coordination: Coordination is intact.     Gait: Gait is intact.     Deep Tendon Reflexes: Reflexes are normal and symmetric.     Reflex Scores:      Brachioradialis reflexes are 2+ on the right side and 2+ on the left side.      Patellar reflexes are 2+ on the right side and 2+ on the left side. Psychiatric:        Attention and  Perception: Attention normal.        Mood and Affect: Mood normal.  Speech: Speech normal.        Behavior: Behavior normal. Behavior is cooperative.        Thought Content: Thought content normal.     Results for orders placed or performed during the hospital encounter of 02/02/23  CBC with Differential  Result Value Ref Range   WBC 4.5 4.0 - 10.5 K/uL   RBC 4.47 3.87 - 5.11 MIL/uL   Hemoglobin 13.6 12.0 - 15.0 g/dL   HCT 16.1 09.6 - 04.5 %   MCV 89.3 80.0 - 100.0 fL   MCH 30.4 26.0 - 34.0 pg   MCHC 34.1 30.0 - 36.0 g/dL   RDW 40.9 81.1 - 91.4 %   Platelets 274 150 - 400 K/uL   nRBC 0.0 0.0 - 0.2 %   Neutrophils Relative % 67 %   Neutro Abs 3.0 1.7 - 7.7 K/uL   Lymphocytes Relative 27 %   Lymphs Abs 1.2 0.7 - 4.0 K/uL   Monocytes Relative 5 %   Monocytes Absolute 0.2 0.1 - 1.0 K/uL   Eosinophils Relative 0 %   Eosinophils Absolute 0.0 0.0 - 0.5 K/uL   Basophils Relative 1 %   Basophils Absolute 0.0 0.0 - 0.1 K/uL   Immature Granulocytes 0 %   Abs Immature Granulocytes 0.01 0.00 - 0.07 K/uL  Comprehensive metabolic panel  Result Value Ref Range   Sodium 139 135 - 145 mmol/L   Potassium 4.0 3.5 - 5.1 mmol/L   Chloride 106 98 - 111 mmol/L   CO2 23 22 - 32 mmol/L   Glucose, Bld 104 (H) 70 - 99 mg/dL   BUN 12 6 - 20 mg/dL   Creatinine, Ser 7.82 0.44 - 1.00 mg/dL   Calcium 9.0 8.9 - 95.6 mg/dL   Total Protein 7.2 6.5 - 8.1 g/dL   Albumin 4.3 3.5 - 5.0 g/dL   AST 20 15 - 41 U/L   ALT 15 0 - 44 U/L   Alkaline Phosphatase 50 38 - 126 U/L   Total Bilirubin 0.8 0.3 - 1.2 mg/dL   GFR, Estimated >21 >30 mL/min   Anion gap 10 5 - 15  Magnesium  Result Value Ref Range   Magnesium 2.0 1.7 - 2.4 mg/dL  Urinalysis, Routine w reflex microscopic -Urine, Clean Catch  Result Value Ref Range   Color, Urine STRAW (A) YELLOW   APPearance CLEAR CLEAR   Specific Gravity, Urine 1.015 1.005 - 1.030   pH 7.5 5.0 - 8.0   Glucose, UA NEGATIVE NEGATIVE mg/dL   Hgb urine dipstick  NEGATIVE NEGATIVE   Bilirubin Urine NEGATIVE NEGATIVE   Ketones, ur NEGATIVE NEGATIVE mg/dL   Protein, ur NEGATIVE NEGATIVE mg/dL   Nitrite NEGATIVE NEGATIVE   Leukocytes,Ua NEGATIVE NEGATIVE  POC Urine Pregnancy, ED  Result Value Ref Range   Preg Test, Ur NEGATIVE NEGATIVE      Assessment & Plan:   Problem List Items Addressed This Visit       Other   Bipolar 2 disorder - Primary    Chronic, ongoing.  Followed by psychiatry.  Denies SI/HI. Continue medication regimen as prescribed by them and attempt to get notes. Discussed with her at length and educated.  Has safety plan in place if suicidal ideation were to present, is aware to immediately go to ER if intrusive thoughts. Return  for physical in December.      Seizure-like activity    No further events, will continue to work with psychiatry.  Appreciate their input.  Had  negative neurology work-up in past.        Follow up plan: Return for as scheduled December 9th for physical.

## 2023-03-21 DIAGNOSIS — F3181 Bipolar II disorder: Secondary | ICD-10-CM | POA: Diagnosis not present

## 2023-03-21 DIAGNOSIS — F410 Panic disorder [episodic paroxysmal anxiety] without agoraphobia: Secondary | ICD-10-CM | POA: Diagnosis not present

## 2023-03-27 DIAGNOSIS — F4312 Post-traumatic stress disorder, chronic: Secondary | ICD-10-CM | POA: Diagnosis not present

## 2023-03-30 ENCOUNTER — Other Ambulatory Visit: Payer: Self-pay | Admitting: Nurse Practitioner

## 2023-03-30 NOTE — Telephone Encounter (Signed)
Long term medication use- has upcoming appointment Requested Prescriptions  Pending Prescriptions Disp Refills   busPIRone (BUSPAR) 10 MG tablet [Pharmacy Med Name: BUSPIRONE HCL 10 MG TABLET] 180 tablet 2    Sig: TAKE 1 TABLET BY MOUTH TWICE A DAY     Psychiatry: Anxiolytics/Hypnotics - Non-controlled Passed - 03/30/2023 11:33 AM      Passed - Valid encounter within last 12 months    Recent Outpatient Visits           3 weeks ago Bipolar 2 disorder (HCC)   [place] [facility] [place], [place] T, NP   1 month ago Seizure-like activity (HCC)   [place] [facility] [place], Corrie Dandy T, NP   3 months ago Pelvic pain   [place] [facility] [place], [place] T, NP   4 months ago Sore throat   [place] [facility] [place], [place] T, NP   5 months ago Bipolar I disorder [facility])    [facility] [place], Dorie Rank, NP       Future Appointments             In 6 months [place], Dorie Rank, NP  [facility]

## 2023-04-07 ENCOUNTER — Ambulatory Visit (INDEPENDENT_AMBULATORY_CARE_PROVIDER_SITE_OTHER): Payer: Medicaid Other

## 2023-04-07 DIAGNOSIS — Z789 Other specified health status: Secondary | ICD-10-CM | POA: Diagnosis not present

## 2023-04-07 LAB — PREGNANCY, URINE: Preg Test, Ur: NEGATIVE

## 2023-04-11 DIAGNOSIS — F3181 Bipolar II disorder: Secondary | ICD-10-CM | POA: Diagnosis not present

## 2023-04-11 DIAGNOSIS — F410 Panic disorder [episodic paroxysmal anxiety] without agoraphobia: Secondary | ICD-10-CM | POA: Diagnosis not present

## 2023-04-11 DIAGNOSIS — F4312 Post-traumatic stress disorder, chronic: Secondary | ICD-10-CM | POA: Diagnosis not present

## 2023-04-12 DIAGNOSIS — F4312 Post-traumatic stress disorder, chronic: Secondary | ICD-10-CM | POA: Diagnosis not present

## 2023-04-12 DIAGNOSIS — F410 Panic disorder [episodic paroxysmal anxiety] without agoraphobia: Secondary | ICD-10-CM | POA: Diagnosis not present

## 2023-04-13 ENCOUNTER — Ambulatory Visit: Payer: Medicaid Other | Admitting: Family Medicine

## 2023-04-26 DIAGNOSIS — F4312 Post-traumatic stress disorder, chronic: Secondary | ICD-10-CM | POA: Diagnosis not present

## 2023-05-09 DIAGNOSIS — F410 Panic disorder [episodic paroxysmal anxiety] without agoraphobia: Secondary | ICD-10-CM | POA: Diagnosis not present

## 2023-05-09 DIAGNOSIS — F3181 Bipolar II disorder: Secondary | ICD-10-CM | POA: Diagnosis not present

## 2023-05-09 DIAGNOSIS — F4312 Post-traumatic stress disorder, chronic: Secondary | ICD-10-CM | POA: Diagnosis not present

## 2023-05-10 DIAGNOSIS — F410 Panic disorder [episodic paroxysmal anxiety] without agoraphobia: Secondary | ICD-10-CM | POA: Diagnosis not present

## 2023-05-10 DIAGNOSIS — F4312 Post-traumatic stress disorder, chronic: Secondary | ICD-10-CM | POA: Diagnosis not present

## 2023-05-10 DIAGNOSIS — F3181 Bipolar II disorder: Secondary | ICD-10-CM | POA: Diagnosis not present

## 2023-05-24 DIAGNOSIS — F410 Panic disorder [episodic paroxysmal anxiety] without agoraphobia: Secondary | ICD-10-CM | POA: Diagnosis not present

## 2023-05-24 DIAGNOSIS — F3181 Bipolar II disorder: Secondary | ICD-10-CM | POA: Diagnosis not present

## 2023-05-24 DIAGNOSIS — F4312 Post-traumatic stress disorder, chronic: Secondary | ICD-10-CM | POA: Diagnosis not present

## 2023-05-25 DIAGNOSIS — F3181 Bipolar II disorder: Secondary | ICD-10-CM | POA: Diagnosis not present

## 2023-05-25 DIAGNOSIS — F410 Panic disorder [episodic paroxysmal anxiety] without agoraphobia: Secondary | ICD-10-CM | POA: Diagnosis not present

## 2023-05-25 DIAGNOSIS — F4312 Post-traumatic stress disorder, chronic: Secondary | ICD-10-CM | POA: Diagnosis not present

## 2023-05-30 ENCOUNTER — Ambulatory Visit: Payer: Medicaid Other | Admitting: Nurse Practitioner

## 2023-05-30 ENCOUNTER — Encounter: Payer: Self-pay | Admitting: Nurse Practitioner

## 2023-05-30 VITALS — BP 119/79 | HR 74 | Temp 98.3°F | Ht 65.0 in | Wt 131.4 lb

## 2023-05-30 DIAGNOSIS — R399 Unspecified symptoms and signs involving the genitourinary system: Secondary | ICD-10-CM

## 2023-05-30 DIAGNOSIS — Z113 Encounter for screening for infections with a predominantly sexual mode of transmission: Secondary | ICD-10-CM

## 2023-05-30 DIAGNOSIS — R768 Other specified abnormal immunological findings in serum: Secondary | ICD-10-CM | POA: Diagnosis not present

## 2023-05-30 DIAGNOSIS — R7689 Other specified abnormal immunological findings in serum: Secondary | ICD-10-CM

## 2023-05-30 MED ORDER — METRONIDAZOLE 500 MG PO TABS: 500.0000 mg | ORAL_TABLET | Freq: Two times a day (BID) | ORAL | 0 refills | Status: AC

## 2023-05-30 MED ORDER — VALACYCLOVIR HCL 1 G PO TABS: 1000.0000 mg | ORAL_TABLET | Freq: Every day | ORAL | 4 refills | Status: DC

## 2023-05-30 NOTE — Progress Notes (Signed)
BP 119/79   Pulse 74   Temp 98.3 F (36.8 C) (Oral)   Ht 5\' 5"  (1.651 m)   Wt 131 lb 6.4 oz (59.6 kg)   SpO2 100%   BMI 21.87 kg/m    Subjective:    Patient ID: Erica Long, female    DOB: 2001/11/12, 22 y.o.   MRN: [id number]  HPI: Erica Long is a 22 y.o. female  Chief Complaint  Patient presents with   Urinary Frequency    For Since Sunday   URINARY SYMPTOMS Started on Sunday.  She would like STD screening.  Has HSV 2, recent outbreak 2 weeks -- has once or twice a month.  Dysuria: a little bit Urinary frequency: yes Urgency: yes Small volume voids: no Symptom severity: yes Urinary incontinence: no Foul odor: no Hematuria: no -- is on cycle at present Abdominal pain: cramping yesterday Back pain: no Suprapubic pain/pressure: no Flank pain: no Fever:  no Vomiting: no Status: stable Previous urinary tract infection: yes Recurrent urinary tract infection: no Sexual activity: practicing safe sex History of sexually transmitted disease: yes -- HSV 2 Treatments attempted: increasing fluids    Relevant past medical, surgical, family and social history reviewed and updated as indicated. Interim medical history since our last visit reviewed. Allergies and medications reviewed and updated.  Review of Systems  Constitutional:  Negative for activity change, appetite change, diaphoresis, fatigue and fever.  Respiratory:  Negative for cough, chest tightness and shortness of breath.   Cardiovascular:  Negative for chest pain, palpitations and leg swelling.  Gastrointestinal: Negative.   Genitourinary:  Positive for dysuria, frequency, genital sores (2 weeks ago) and urgency. Negative for decreased urine volume, difficulty urinating, flank pain, hematuria, vaginal discharge and vaginal pain.  Neurological: Negative.   Psychiatric/Behavioral: Negative.     Per HPI unless specifically indicated above     Objective:    BP 119/79   Pulse 74   Temp 98.3 F (36.8  C) (Oral)   Ht 5\' 5"  (1.651 m)   Wt 131 lb 6.4 oz (59.6 kg)   SpO2 100%   BMI 21.87 kg/m   Wt Readings from Last 3 Encounters:  05/30/23 131 lb 6.4 oz (59.6 kg)  03/09/23 131 lb 3.2 oz (59.5 kg)  02/06/23 127 lb 1.6 oz (57.7 kg)    Physical Exam Vitals and nursing note reviewed.  Constitutional:      General: She is awake. She is not in acute distress.    Appearance: She is well-developed and well-groomed. She is not ill-appearing or toxic-appearing.  HENT:     Head: Normocephalic.     Right Ear: Hearing and external ear normal.     Left Ear: Hearing and external ear normal.  Eyes:     General: Lids are normal.        Right eye: No discharge.        Left eye: No discharge.     Extraocular Movements: Extraocular movements intact.     Conjunctiva/sclera: Conjunctivae normal.     Pupils: Pupils are equal, round, and reactive to light.     Visual Fields: Right eye visual fields normal and left eye visual fields normal.  Neck:     Thyroid: No thyromegaly.     Vascular: No carotid bruit.  Cardiovascular:     Rate and Rhythm: Normal rate and regular rhythm.     Heart sounds: Normal heart sounds. No murmur heard.    No gallop.  Pulmonary:  Effort: Pulmonary effort is normal. No accessory muscle usage or respiratory distress.     Breath sounds: Normal breath sounds.  Abdominal:     General: Bowel sounds are normal. There is no distension.     Palpations: Abdomen is soft.     Tenderness: There is no abdominal tenderness. There is no right CVA tenderness or left CVA tenderness.  Musculoskeletal:     Cervical back: Normal range of motion and neck supple.     Right lower leg: No edema.     Left lower leg: No edema.  Lymphadenopathy:     Cervical: No cervical adenopathy.  Skin:    General: Skin is warm and dry.  Neurological:     Mental Status: She is alert and oriented to person, place, and time.     Cranial Nerves: Cranial nerves 2-12 are intact.     Motor: Motor function  is intact.     Coordination: Coordination is intact.     Gait: Gait is intact.     Deep Tendon Reflexes: Reflexes are normal and symmetric.     Reflex Scores:      Brachioradialis reflexes are 2+ on the right side and 2+ on the left side.      Patellar reflexes are 2+ on the right side and 2+ on the left side. Psychiatric:        Attention and Perception: Attention normal.        Mood and Affect: Mood normal.        Speech: Speech normal.        Behavior: Behavior normal. Behavior is cooperative.        Thought Content: Thought content normal.     Results for orders placed or performed in visit on 04/07/23  Pregnancy, urine  Result Value Ref Range   Preg Test, Ur Negative Negative      Assessment & Plan:   Problem List Items Addressed This Visit       Other   HSV-2 seropositive    Currently with more frequent outbreaks, she would like to start preventative.  Will send in Valtrex 1000 MG to take daily.  Educated her on genital herpes and treatment regimen.      Relevant Medications   metroNIDAZOLE (FLAGYL) 500 MG tablet   valACYclovir (VALTREX) 1000 MG tablet   Urinary symptom or sign - Primary    Acute for 3 days.  UA with some + findings, will send for culture to further assess and if UTI present will treated accordingly.  Wet prep + for clue cells, negative trich and yeast.  Educated patient on this diagnosis and treatment.  Start Flagyl BID for 7 days.  Educated on how to take this and possible side effects.      Relevant Orders   WET PREP FOR TRICH, YEAST, CLUE   Urinalysis, Routine w reflex microscopic   Urine Culture   Other Visit Diagnoses     Screen for STD (sexually transmitted disease)       Obtain all STD labs today per her request.   Relevant Orders   GC/Chlamydia Probe Amp   RPR   HIV Antibody (routine testing w rflx)   HSV 1 and 2 Ab, IgG        Follow up plan: Return if symptoms worsen or fail to improve.

## 2023-05-30 NOTE — Assessment & Plan Note (Signed)
Currently with more frequent outbreaks, she would like to start preventative.  Will send in Valtrex 1000 MG to take daily.  Educated her on genital herpes and treatment regimen.

## 2023-05-30 NOTE — Assessment & Plan Note (Signed)
Acute for 3 days.  UA with some + findings, will send for culture to further assess and if UTI present will treated accordingly.  Wet prep + for clue cells, negative trich and yeast.  Educated patient on this diagnosis and treatment.  Start Flagyl BID for 7 days.  Educated on how to take this and possible side effects.

## 2023-05-30 NOTE — Patient Instructions (Signed)

## 2023-05-31 LAB — URINALYSIS, ROUTINE W REFLEX MICROSCOPIC
Bilirubin, UA: NEGATIVE
Glucose, UA: NEGATIVE
Ketones, UA: NEGATIVE
Nitrite, UA: NEGATIVE
Specific Gravity, UA: 1.02 (ref 1.005–1.030)
Urobilinogen, Ur: 0.2 mg/dL (ref 0.2–1.0)
pH, UA: 7.5 (ref 5.0–7.5)

## 2023-05-31 LAB — HIV ANTIBODY (ROUTINE TESTING W REFLEX): HIV Screen 4th Generation wRfx: NONREACTIVE

## 2023-05-31 LAB — MICROSCOPIC EXAMINATION

## 2023-05-31 LAB — WET PREP FOR TRICH, YEAST, CLUE
Clue Cell Exam: POSITIVE — AB
Trichomonas Exam: NEGATIVE
Yeast Exam: NEGATIVE

## 2023-05-31 LAB — HSV 1 AND 2 AB, IGG
HSV 1 Glycoprotein G Ab, IgG: <0.91 index (ref 0.00–0.90)
HSV 2 IgG, Type Spec: 15.8 index — ABNORMAL HIGH (ref 0.00–0.90)

## 2023-05-31 LAB — RPR: RPR Ser Ql: NONREACTIVE

## 2023-05-31 NOTE — Progress Notes (Signed)
Contacted via MyChart   Urine gonorrhea and chlamydia still pending.  HSV2 testing has trended up which explained recent outbreaks, start Valtrex.  Remainder of labs so far stable.:)

## 2023-06-01 LAB — GC/CHLAMYDIA PROBE AMP
Chlamydia trachomatis, NAA: NEGATIVE
Neisseria Gonorrhoeae by PCR: NEGATIVE

## 2023-06-01 NOTE — Progress Notes (Signed)
Contacted via MyChart   Urine testing is negative for gonorrhea and chlamydia:)

## 2023-06-02 LAB — URINE CULTURE

## 2023-06-05 DIAGNOSIS — S20211A Contusion of right front wall of thorax, initial encounter: Secondary | ICD-10-CM | POA: Diagnosis not present

## 2023-06-05 DIAGNOSIS — R0781 Pleurodynia: Secondary | ICD-10-CM | POA: Diagnosis not present

## 2023-06-06 ENCOUNTER — Ambulatory Visit: Payer: Medicaid Other | Admitting: Nurse Practitioner

## 2023-06-07 ENCOUNTER — Telehealth: Payer: Medicaid Other | Admitting: Nurse Practitioner

## 2023-06-07 ENCOUNTER — Encounter: Payer: Self-pay | Admitting: Nurse Practitioner

## 2023-06-07 DIAGNOSIS — N3000 Acute cystitis without hematuria: Secondary | ICD-10-CM | POA: Diagnosis not present

## 2023-06-07 MED ORDER — NITROFURANTOIN MONOHYD MACRO 100 MG PO CAPS: 100.0000 mg | ORAL_CAPSULE | Freq: Two times a day (BID) | ORAL | 0 refills | Status: AC

## 2023-06-07 NOTE — Progress Notes (Signed)
E-Visit for Urinary Problems  We are sorry that you are not feeling well.  Here is how we plan to help!  Based on what you shared with me it looks like you most likely have a simple urinary tract infection.  A UTI (Urinary Tract Infection) is a bacterial infection of the bladder.  Most cases of urinary tract infections are simple to treat but a key part of your care is to encourage you to drink plenty of fluids and watch your symptoms carefully.  I have prescribed MacroBid 100 mg twice a day for 5 days.  Your symptoms should gradually improve. Call us if the burning in your urine worsens, you develop worsening fever, back pain or pelvic pain or if your symptoms do not resolve after completing the antibiotic.  Urinary tract infections can be prevented by drinking plenty of water to keep your body hydrated.  Also be sure when you wipe, wipe from front to back and don't hold it in!  If possible, empty your bladder every 4 hours.  HOME CARE Drink plenty of fluids Compete the full course of the antibiotics even if the symptoms resolve Remember, when you need to go.go. Holding in your urine can increase the likelihood of getting a UTI! GET HELP RIGHT AWAY IF: You cannot urinate You get a high fever Worsening back pain occurs You see blood in your urine You feel sick to your stomach or throw up You feel like you are going to pass out  MAKE SURE YOU  Understand these instructions. Will watch your condition. Will get help right away if you are not doing well or get worse.   Thank you for choosing an e-visit.  Your e-visit answers were reviewed by a board certified advanced clinical practitioner to complete your personal care plan. Depending upon the condition, your plan could have included both over the counter or prescription medications.  Please review your pharmacy choice. Make sure the pharmacy is open so you can pick up prescription now. If there is a problem, you may contact your  provider through MyChart messaging and have the prescription routed to another pharmacy.  Your safety is important to us. If you have drug allergies check your prescription carefully.   For the next 24 hours you can use MyChart to ask questions about today's visit, request a non-urgent call back, or ask for a work or school excuse. You will get an email in the next two days asking about your experience. I hope that your e-visit has been valuable and will speed your recovery.   Meds ordered this encounter  Medications   nitrofurantoin, macrocrystal-monohydrate, (MACROBID) 100 MG capsule    Sig: Take 1 capsule (100 mg total) by mouth 2 (two) times daily for 5 days.    Dispense:  10 capsule    Refill:  0     I spent approximately 5 minutes reviewing the patient's history, current symptoms and coordinating their care today.   

## 2023-06-08 DIAGNOSIS — F3181 Bipolar II disorder: Secondary | ICD-10-CM | POA: Diagnosis not present

## 2023-06-08 DIAGNOSIS — F4312 Post-traumatic stress disorder, chronic: Secondary | ICD-10-CM | POA: Diagnosis not present

## 2023-06-08 DIAGNOSIS — F410 Panic disorder [episodic paroxysmal anxiety] without agoraphobia: Secondary | ICD-10-CM | POA: Diagnosis not present

## 2023-06-30 ENCOUNTER — Ambulatory Visit (INDEPENDENT_AMBULATORY_CARE_PROVIDER_SITE_OTHER): Payer: Medicaid Other

## 2023-06-30 DIAGNOSIS — Z789 Other specified health status: Secondary | ICD-10-CM | POA: Diagnosis not present

## 2023-06-30 MED ORDER — MEDROXYPROGESTERONE ACETATE 150 MG/ML IM SUSY
150.0000 mg | PREFILLED_SYRINGE | Freq: Once | INTRAMUSCULAR | Status: AC
  Administered 2023-06-30: 150 mg via INTRAMUSCULAR

## 2023-07-05 ENCOUNTER — Emergency Department
Admission: EM | Admit: 2023-07-05 | Discharge: 2023-07-05 | Disposition: A | Discharge status: Home / Self Care | Payer: Medicaid Other | Attending: Emergency Medicine | Admitting: Emergency Medicine

## 2023-07-05 ENCOUNTER — Other Ambulatory Visit: Payer: Self-pay

## 2023-07-05 DIAGNOSIS — F3181 Bipolar II disorder: Secondary | ICD-10-CM | POA: Diagnosis not present

## 2023-07-05 DIAGNOSIS — F410 Panic disorder [episodic paroxysmal anxiety] without agoraphobia: Secondary | ICD-10-CM | POA: Diagnosis not present

## 2023-07-05 DIAGNOSIS — R569 Unspecified convulsions: Secondary | ICD-10-CM | POA: Diagnosis not present

## 2023-07-05 DIAGNOSIS — G4089 Other seizures: Secondary | ICD-10-CM | POA: Diagnosis not present

## 2023-07-05 DIAGNOSIS — R0689 Other abnormalities of breathing: Secondary | ICD-10-CM | POA: Diagnosis not present

## 2023-07-05 DIAGNOSIS — F4312 Post-traumatic stress disorder, chronic: Secondary | ICD-10-CM | POA: Diagnosis not present

## 2023-07-05 DIAGNOSIS — R404 Transient alteration of awareness: Secondary | ICD-10-CM | POA: Diagnosis not present

## 2023-07-05 DIAGNOSIS — R9431 Abnormal electrocardiogram [ECG] [EKG]: Secondary | ICD-10-CM | POA: Diagnosis not present

## 2023-07-05 LAB — CBC
HCT: 38.9 % (ref 36.0–46.0)
Hemoglobin: 13.2 g/dL (ref 12.0–15.0)
MCH: 30.6 pg (ref 26.0–34.0)
MCHC: 33.9 g/dL (ref 30.0–36.0)
MCV: 90.3 fL (ref 80.0–100.0)
Platelets: 217 10*3/uL (ref 150–400)
RBC: 4.31 MIL/uL (ref 3.87–5.11)
RDW: 12.2 % (ref 11.5–15.5)
WBC: 6.2 10*3/uL (ref 4.0–10.5)
nRBC: 0.6 % — ABNORMAL HIGH (ref 0.0–0.2)

## 2023-07-05 LAB — BASIC METABOLIC PANEL
Anion gap: 8 (ref 5–15)
BUN: 9 mg/dL (ref 6–20)
CO2: 21 mmol/L — ABNORMAL LOW (ref 22–32)
Calcium: 8.5 mg/dL — ABNORMAL LOW (ref 8.9–10.3)
Chloride: 109 mmol/L (ref 98–111)
Creatinine, Ser: 0.83 mg/dL (ref 0.44–1.00)
GFR, Estimated: >60 mL/min (ref >60)
Glucose, Bld: 106 mg/dL — ABNORMAL HIGH (ref 70–99)
Potassium: 3.3 mmol/L — ABNORMAL LOW (ref 3.5–5.1)
Sodium: 138 mmol/L (ref 135–145)

## 2023-07-05 LAB — POC URINE PREG, ED: Preg Test, Ur: NEGATIVE

## 2023-07-05 NOTE — ED Notes (Signed)
Pt states feeling a little foggy, pt AOx4

## 2023-07-05 NOTE — ED Triage Notes (Signed)
Pt to ED via [facility] from work for seizures, focal, teeth clenching for 30 min. Pt had 3 seizure w/EMS. Was given 3.25mg  versed at 1524. Per EMS pt had seizure yesterday for which she did not get evaluated.  95BSR 97% 24rr 130/76 130CBG

## 2023-07-05 NOTE — Discharge Instructions (Signed)
Please seek medical attention for any high fevers, chest pain, shortness of breath, change in behavior, persistent vomiting, bloody stool or any other new or concerning symptoms.  

## 2023-07-05 NOTE — ED Notes (Signed)
EMS also states pt has Hx of seizure and has been complaint with medications.

## 2023-07-06 DIAGNOSIS — F4312 Post-traumatic stress disorder, chronic: Secondary | ICD-10-CM | POA: Diagnosis not present

## 2023-07-06 DIAGNOSIS — F410 Panic disorder [episodic paroxysmal anxiety] without agoraphobia: Secondary | ICD-10-CM | POA: Diagnosis not present

## 2023-07-06 DIAGNOSIS — F3181 Bipolar II disorder: Secondary | ICD-10-CM | POA: Diagnosis not present

## 2023-07-06 NOTE — ED Provider Notes (Signed)
[facility] Provider Note    Event Date/Time   First MD Initiated Contact with Patient 07/05/23 1556     (approximate)   History   Seizures   HPI  Erica Long is a 22 y.o. female who presents to the emergency department today after seizure-like activity.  The patient states that she has a history of episodes like this.  She has been evaluated and and is not on any antiepileptic.  She states she was told they are not true seizure episodes.  They do occur more when the patient is stressed since he states that she has been stressed recently.  At the time my exam she says she is feeling better.     Physical Exam   Triage Vital Signs: ED Triage Vitals  Encounter Vitals Group     BP 07/05/23 1541 116/73     Systolic BP Percentile --      Diastolic BP Percentile --      Pulse Rate 07/05/23 1541 82     Resp 07/05/23 1541 16     Temp 07/05/23 1554 98.5 F (36.9 C)     Temp Source 07/05/23 1554 Oral     SpO2 07/05/23 1541 100 %     Weight 07/05/23 1539 133 lb (60.3 kg)     Height 07/05/23 1539 5\' 3"  (1.6 m)     Head Circumference --      Peak Flow --      Pain Score 07/05/23 1820 0     Pain Loc --      Pain Education --      Exclude from Growth Chart --     Most recent vital signs: Vitals:   07/05/23 1730 07/05/23 1800  BP: 114/80 117/72  Pulse: 86 89  Resp:  (!) 21  Temp:    SpO2: 97% 100%   General: Awake, alert, oriented. CV:  Good peripheral perfusion. Regular rate and rhythm. Resp:  Normal effort. Lungs clear. Abd:  No distention.    ED Results / Procedures / Treatments   Labs (all labs ordered are listed, but only abnormal results are displayed) Labs Reviewed  CBC - Abnormal; Notable for the following components:      Result Value   nRBC 0.6 (*)    All other components within normal limits  BASIC METABOLIC PANEL - Abnormal; Notable for the following components:   Potassium 3.3 (*)    CO2 21 (*)    Glucose, Bld 106 (*)     Calcium 8.5 (*)    All other components within normal limits  POC URINE PREG, ED     EKG  I, Phineas Semen, attending physician, personally viewed and interpreted this EKG  EKG Time: 1539 Rate: 88 Rhythm: sinus rhythm Axis: normal Intervals: qtc 424 QRS: narrow, q waves v1 ST changes: no st elevation Impression: abnormal ekg   RADIOLOGY None   PROCEDURES:  Critical Care performed: No   MEDICATIONS ORDERED IN ED: Medications - No data to display   IMPRESSION / MDM / ASSESSMENT AND PLAN / ED COURSE  I reviewed the triage vital signs and the nursing notes.                              Differential diagnosis includes, but is not limited to, seizure, pseudoseizure  Patient's presentation is most consistent with acute presentation with potential threat to life or bodily function.  The patient is on the cardiac monitor to evaluate for evidence of arrhythmia and/or significant heart rate changes.  Patient presents to the emergency department today after seizure-like episodes.  Patient has history of pseudoseizures.  She is completely awake and alert during my exam.  Blood work without concerning findings.  Patient was observed in the emergency department without any further episodes.  Will plan on discharging.      FINAL CLINICAL IMPRESSION(S) / ED DIAGNOSES   Final diagnoses:  Seizure-like activity (HCC)    Note:  This document was prepared using Dragon voice recognition software and may include unintentional dictation errors.    Phineas Semen, MD 07/06/23 2005

## 2023-07-10 ENCOUNTER — Ambulatory Visit: Payer: Self-pay

## 2023-07-10 ENCOUNTER — Emergency Department
Admission: EM | Admit: 2023-07-10 | Discharge: 2023-07-10 | Disposition: A | Discharge status: Home / Self Care | Payer: Medicaid Other | Attending: Student in an Organized Health Care Education/Training Program | Admitting: Student in an Organized Health Care Education/Training Program

## 2023-07-10 DIAGNOSIS — R404 Transient alteration of awareness: Secondary | ICD-10-CM | POA: Diagnosis not present

## 2023-07-10 DIAGNOSIS — R258 Other abnormal involuntary movements: Secondary | ICD-10-CM | POA: Insufficient documentation

## 2023-07-10 DIAGNOSIS — R569 Unspecified convulsions: Secondary | ICD-10-CM | POA: Diagnosis not present

## 2023-07-10 DIAGNOSIS — R9431 Abnormal electrocardiogram [ECG] [EKG]: Secondary | ICD-10-CM | POA: Diagnosis not present

## 2023-07-10 LAB — BASIC METABOLIC PANEL
Anion gap: 9 (ref 5–15)
BUN: 10 mg/dL (ref 6–20)
CO2: 20 mmol/L — ABNORMAL LOW (ref 22–32)
Calcium: 8.6 mg/dL — ABNORMAL LOW (ref 8.9–10.3)
Chloride: 108 mmol/L (ref 98–111)
Creatinine, Ser: 0.78 mg/dL (ref 0.44–1.00)
GFR, Estimated: >60 mL/min (ref >60)
Glucose, Bld: 90 mg/dL (ref 70–99)
Potassium: 4 mmol/L (ref 3.5–5.1)
Sodium: 137 mmol/L (ref 135–145)

## 2023-07-10 LAB — POC URINE PREG, ED: Preg Test, Ur: NEGATIVE

## 2023-07-10 NOTE — ED Triage Notes (Signed)
Pt from work had a witnessed 10-15 minute seizure, pt does not take any regular seizure medications. EMS reports pt had a seizure yesterday. Pt has a f/u Thursday with psych. Pt given 2.5mg  versed. A&Ox4.

## 2023-07-10 NOTE — Telephone Encounter (Signed)
Summary: Medication instructions.   Pt is calling in because she would like to know if she can take LORazepam (ATIVAN) 0.5 MG tablet [[id number]] more than once a day or is it something she's supposed to take daily.        Chief Complaint: Asking directions on Ativan. Read pt. Instructions from her medication list. Verbalizes understanding. Symptoms: Above Frequency:  Pertinent Negatives: Patient denies  Disposition: [] ED /[] Urgent Care (no appt availability in office) / [] Appointment(In office/virtual)/ []  [facility]/ [x] Home Care/ [] Refused Recommended Disposition /[] [facility]/ []  Follow-up with PCP Additional Notes: Pt. Verbalizes understanding.  Reason for Disposition  Caller has medicine question only, adult not sick, AND triager answers question  Answer Assessment - Initial Assessment Questions 1. NAME of MEDICINE: "What medicine(s) are you calling about?"     Ativan 2. QUESTION: "What is your question?" (e.g., double dose of medicine, side effect)     Asking directions on how to take medicine 3. PRESCRIBER: "Who prescribed the medicine?" Reason: if prescribed by specialist, call should be referred to that group.     [place] 4. SYMPTOMS: "Do you have any symptoms?" If Yes, ask: "What symptoms are you having?"  "How bad are the symptoms (e.g., mild, moderate, severe)     N/a 5. PREGNANCY:  "Is there any chance that you are pregnant?" "When was your last menstrual period?"     No  Protocols used: Medication Question Call-A-AH

## 2023-07-10 NOTE — ED Provider Notes (Signed)
[facility] Provider Note    Event Date/Time   First MD Initiated Contact with Patient 07/10/23 1241     (approximate)   History   Seizures   HPI  Erica Long is a 22 y.o. female presents from work for an episode of shaking lasting roughly 10 to 15 minutes.  States that she has had multiple episodes of the same.  Has been evaluated by neurology and told that these episodes are nonepileptic.  States she has been dealing with some excess stress.  Denies any pain.  Did not bite her tongue.  No loss of bladder control.     Physical Exam   Triage Vital Signs: ED Triage Vitals  Encounter Vitals Group     BP --      Systolic BP Percentile --      Diastolic BP Percentile --      Pulse Rate 07/10/23 1243 77     Resp 07/10/23 1243 18     Temp 07/10/23 1243 98.4 F (36.9 C)     Temp Source 07/10/23 1243 Oral     SpO2 07/10/23 1243 100 %     Weight 07/10/23 1250 132 lb 4.4 oz (60 kg)     Height 07/10/23 1250 5\' 3"  (1.6 m)     Head Circumference --      Peak Flow --      Pain Score 07/10/23 1250 0     Pain Loc --      Pain Education --      Exclude from Growth Chart --     Most recent vital signs: Vitals:   07/10/23 1252 07/10/23 1300  BP: 105/82 109/81  Pulse:  74  Resp:  17  Temp:    SpO2:  99%     Constitutional: Alert  Eyes: Conjunctivae are normal.  Head: Atraumatic. Nose: No congestion/rhinnorhea. Mouth/Throat: Mucous membranes are moist.   Neck: Painless ROM.  Cardiovascular:   Good peripheral circulation. Respiratory: Normal respiratory effort.  No retractions.  Gastrointestinal: Soft and nontender.  Musculoskeletal:  no deformity Neurologic:  MAE spontaneously. No gross focal neurologic deficits are appreciated.  Skin:  Skin is warm, dry and intact. No rash noted. Psychiatric: calm and cooperative    ED Results / Procedures / Treatments   Labs (all labs ordered are listed, but only abnormal results are displayed) Labs  Reviewed  BASIC METABOLIC PANEL - Abnormal; Notable for the following components:      Result Value   CO2 20 (*)    Calcium 8.6 (*)    All other components within normal limits  POC URINE PREG, ED     EKG  ED ECG REPORT I, Willy Eddy, the attending physician, personally viewed and interpreted this ECG.   Date: 07/10/2023  EKG Time: 12:49  Rate: 75  Rhythm: sinus  Axis: normal  Intervals: normal qt  ST&T Change: no stemi, no depressions    RADIOLOGY Please see ED Course for my review and interpretation.  I personally reviewed all radiographic images ordered to evaluate for the above acute complaints and reviewed radiology reports and findings.  These findings were personally discussed with the patient.  Please see medical record for radiology report.    PROCEDURES:  Critical Care performed: No  Procedures   MEDICATIONS ORDERED IN ED: Medications - No data to display   IMPRESSION / MDM / ASSESSMENT AND PLAN / ED COURSE  I reviewed the triage vital signs and the nursing notes.  Differential diagnosis includes, but is not limited to, pseudoseizure, stress reaction, dysrhythmia,  Patient presenting to the ER for evaluation of symptoms as described above.  Based on symptoms, risk factors and considered above differential, this presenting complaint could reflect a potentially life-threatening illness therefore the patient will be placed on continuous pulse oximetry and telemetry for monitoring.  Laboratory evaluation will be sent to evaluate for the above complaints.  Blood work is reassuring.  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  Do not feel that further diagnostic testing clinically indicated.  Does appear stable and appropriate for outpatient follow-up.        FINAL CLINICAL IMPRESSION(S) / ED DIAGNOSES   Final diagnoses:  Seizure-like activity (HCC)     Rx / DC  Orders   ED Discharge Orders     None        Note:  This document was prepared using Dragon voice recognition software and may include unintentional dictation errors.    Willy Eddy, MD 07/10/23 [phone]

## 2023-07-11 ENCOUNTER — Other Ambulatory Visit: Payer: Self-pay

## 2023-07-11 ENCOUNTER — Emergency Department
Admission: EM | Admit: 2023-07-11 | Discharge: 2023-07-11 | Discharge status: Against Medical Advice | Payer: Medicaid Other | Attending: Student in an Organized Health Care Education/Training Program | Admitting: Student in an Organized Health Care Education/Training Program

## 2023-07-11 DIAGNOSIS — R569 Unspecified convulsions: Secondary | ICD-10-CM | POA: Diagnosis not present

## 2023-07-11 DIAGNOSIS — R457 State of emotional shock and stress, unspecified: Secondary | ICD-10-CM | POA: Diagnosis not present

## 2023-07-11 DIAGNOSIS — I10 Essential (primary) hypertension: Secondary | ICD-10-CM | POA: Diagnosis not present

## 2023-07-11 DIAGNOSIS — R064 Hyperventilation: Secondary | ICD-10-CM | POA: Diagnosis not present

## 2023-07-11 DIAGNOSIS — Z5321 Procedure and treatment not carried out due to patient leaving prior to being seen by health care provider: Secondary | ICD-10-CM | POA: Insufficient documentation

## 2023-07-11 DIAGNOSIS — R000 Tachycardia, unspecified: Secondary | ICD-10-CM | POA: Diagnosis not present

## 2023-07-11 LAB — BASIC METABOLIC PANEL
Anion gap: 5 (ref 5–15)
BUN: 10 mg/dL (ref 6–20)
CO2: 23 mmol/L (ref 22–32)
Calcium: 9.2 mg/dL (ref 8.9–10.3)
Chloride: 110 mmol/L (ref 98–111)
Creatinine, Ser: 0.73 mg/dL (ref 0.44–1.00)
GFR, Estimated: >60 mL/min (ref >60)
Glucose, Bld: 111 mg/dL — ABNORMAL HIGH (ref 70–99)
Potassium: 4.1 mmol/L (ref 3.5–5.1)
Sodium: 138 mmol/L (ref 135–145)

## 2023-07-11 LAB — CBC
HCT: 42.3 % (ref 36.0–46.0)
Hemoglobin: 14.3 g/dL (ref 12.0–15.0)
MCH: 30.6 pg (ref 26.0–34.0)
MCHC: 33.8 g/dL (ref 30.0–36.0)
MCV: 90.6 fL (ref 80.0–100.0)
Platelets: 260 10*3/uL (ref 150–400)
RBC: 4.67 MIL/uL (ref 3.87–5.11)
RDW: 12.6 % (ref 11.5–15.5)
WBC: 7.1 10*3/uL (ref 4.0–10.5)
nRBC: 0 % (ref 0.0–0.2)

## 2023-07-11 NOTE — Patient Instructions (Incomplete)
Seizure, Adult A seizure is a sudden burst of abnormal electrical and chemical activity in the brain. Seizures usually last from 30 seconds to 2 minutes.  What are the causes? Common causes of this condition include: Fever or infection. Problems that affect the brain. These may include: A brain or head injury. Bleeding in the brain. A brain tumor. Low levels of blood sugar or salt. Kidney problems or liver problems. Conditions that are passed from parent to child (are inherited). Problems with a substance, such as: Having a reaction to a drug or a medicine. Stopping the use of a substance all of a sudden (withdrawal). A stroke. Disorders that affect how you develop. Sometimes, the cause may not be known.  What increases the risk? Having someone in your family who has epilepsy. In this condition, seizures happen again and again over time. They have no clear cause. Having had a tonic-clonic seizure before. This type of seizure causes you to: Tighten the muscles of the whole body. Lose consciousness. Having had a head injury or strokes before. Having had a lack of oxygen at birth. What are the signs or symptoms? There are many types of seizures. The symptoms vary depending on the type of seizure you have. Symptoms during a seizure Shaking that you cannot control (convulsions) with fast, jerky movements of muscles. Stiffness of the body. Breathing problems. Feeling mixed up (confused). Staring or not responding to sound or touch. Head nodding. Eyes that blink, flutter, or move fast. Drooling, grunting, or making clicking sounds with your mouth Losing control of when you pee or poop. Symptoms before a seizure Feeling afraid, nervous, or worried. Feeling like you may vomit. Feeling like: You are moving when you are not. Things around you are moving when they are not. Feeling like you saw or heard something before (dj vu). Odd tastes or smells. Changes in how you see. You may  see flashing lights or spots. Symptoms after a seizure Feeling confused. Feeling sleepy. Headache. Sore muscles. How is this treated? If your seizure stops on its own, you will not need treatment. If your seizure lasts longer than 5 minutes, you will normally need treatment. Treatment may include: Medicines given through an IV tube. Avoiding things, such as medicines, that are known to cause your seizures. Medicines to prevent seizures. A device to prevent or control seizures. Surgery. A diet low in carbohydrates and high in fat (ketogenic diet). Follow these instructions at home: Medicines Take over-the-counter and prescription medicines only as told by your doctor. Avoid foods or drinks that may keep your medicine from working, such as alcohol. Activity Follow instructions about driving, swimming, or doing things that would be dangerous if you had another seizure. Wait until your doctor says it is safe for you to do these things. If you live in the [place] ask your local department of motor vehicles when you can drive. Get a lot of rest. Teaching others  Teach friends and family what to do when you have a seizure. They should: Help you get down to the ground. Protect your head and body. Loosen any clothing around your neck. Turn you on your side. Know whether or not you need emergency care. Stay with you until you are better. Also, tell them what not to do if you have a seizure. Tell them: They should not hold you down. They should not put anything in your mouth. General instructions Avoid anything that gives you seizures. Keep a seizure diary. Write down: What you remember  about each seizure. What you think caused each seizure. Keep all follow-up visits. Contact a doctor if: You have another seizure or seizures. Call the doctor each time you have a seizure. The pattern of your seizures changes. You keep having seizures with treatment. You have symptoms of being sick or  having an infection. You are not able to take your medicine. Get help right away if: You have any of these problems: A seizure that lasts longer than 5 minutes. Many seizures in a row and you do not feel better between seizures. A seizure that makes it harder to breathe. A seizure and you can no longer speak or use part of your body. You do not wake up right after a seizure. You get hurt during a seizure. You feel confused or have pain right after a seizure. These symptoms may be an emergency. Get help right away. Call your local emergency services (911 in the [place] Do not wait to see if the symptoms will go away. Do not drive yourself to the hospital. Summary A seizure is a sudden burst of abnormal electrical and chemical activity in the brain. Seizures normally last from 30 seconds to 2 minutes. Causes of seizures include illness, injury to the head, low levels of blood sugar or salt, and certain conditions. Most seizures will stop on their own in less than 5 minutes. Seizures that last longer than 5 minutes are a medical emergency and need treatment right away. Many medicines are used to treat seizures. Take over-the-counter and prescription medicines only as told by your doctor. This information is not intended to replace advice given to you by your health care provider. Make sure you discuss any questions you have with your health care provider. Document Revised: 05/06/2020 Document Reviewed: 05/08/2020 Elsevier Patient Education  2024 [employer].

## 2023-07-11 NOTE — ED Triage Notes (Signed)
First Nurse Note: Patient to ED via [facility] from work. Patient an episode of anxiety at work where she was sitting and shaking. Seen at psych and PCP for same. Took her po ativan pta. Aox4 VS WNL

## 2023-07-11 NOTE — ED Triage Notes (Signed)
Pt here via [facility] with a seizure today. Pt states she has a hx of seizures and is compliant with her medication. Pt denies pain.

## 2023-07-12 ENCOUNTER — Encounter: Payer: Self-pay | Admitting: Nurse Practitioner

## 2023-07-12 ENCOUNTER — Ambulatory Visit: Payer: Medicaid Other | Admitting: Nurse Practitioner

## 2023-07-12 VITALS — BP 115/82 | HR 87 | Wt 136.4 lb

## 2023-07-12 DIAGNOSIS — R569 Unspecified convulsions: Secondary | ICD-10-CM | POA: Diagnosis not present

## 2023-07-12 DIAGNOSIS — F3181 Bipolar II disorder: Secondary | ICD-10-CM

## 2023-07-12 DIAGNOSIS — F4312 Post-traumatic stress disorder, chronic: Secondary | ICD-10-CM | POA: Diagnosis not present

## 2023-07-12 DIAGNOSIS — F410 Panic disorder [episodic paroxysmal anxiety] without agoraphobia: Secondary | ICD-10-CM | POA: Diagnosis not present

## 2023-07-12 NOTE — Assessment & Plan Note (Signed)
Currently with increase in activity over past week - ?related to increase in Vraylar dose recently.  She sees her psychiatrist this afternoon and they plan on discussing changing medications.  May benefit return to Depakote which worked well in past and avoidance of antipsychotics if possible.  Would like second opinion from neuro, will place referral to [facility].

## 2023-07-12 NOTE — Assessment & Plan Note (Signed)
Chronic, ongoing.  Followed by psychiatry.  Denies SI/HI. Continue medication regimen as prescribed by them, they are going to discuss changes today. Discussed with her at length and educated.  Has safety plan in place if suicidal ideation were to present, is aware to immediately go to ER if intrusive thoughts.

## 2023-07-12 NOTE — Progress Notes (Signed)
BP 115/82   Pulse 87   Wt 136 lb 6.4 oz (61.9 kg)   SpO2 99%   BMI 24.16 kg/m    Subjective:    Patient ID: Erica Long, female    DOB: Apr 22, 2001, 22 y.o.   MRN: [id number]  HPI: Erica Long is a 22 y.o. female  Chief Complaint  Patient presents with   Seizures    Patient says she had a past history of Seizures. Patient says the last time she had a episode of seizure was about a month ago. Patient says she the last episode of seizures started Wednesday. Patient says she is no longer taking her medication or has she seen her Neurologist recently. Patient says the longest she has noticed since Wednesday was 45 mins and the shortest one was 15 mins.    SEIZURE-LIKE ACTIVITY Presents today for two episodes recently of seizure-like activity.  She was seen by neuro last 02/09/21 for this and had overall reassuring work-up and it was suspected some of this could be from mood.  She is following with psychiatry [facility]), continues on Vraylar with benefit to her Bipolar 2 Disorder + has Ativan to take as needed and Atarax but these are offering no benefit.  She was taken off Depakote due to light headed feeling in mornings, but continued to have this off Depakote.  Has visit with psychiatry today.  Had initial ER visit was 07/05/23 -- she does not remember much about this day.  This was witnessed by a Radio broadcast assistant.  Reports this lasted 20 minutes.  Second ER visit was on 07/10/23 for seizure like activity when she had multiple episodes lasting 15 minutes.  On 07/11/23 she had another episode at work, witnessed by Radio broadcast assistant.  One episode -- lasting about 20 minutes.  Went to ER and did not get seen, left.  She does notice feeling when the episodes come on with heart rate getting fast and irregular, gets really cold or hot feeling, and then her teeth start chattering.  Recently had Vraylar increase in dose about one month ago. Weakness: yes with episodes Trauma: no Recent illness: no Diabetes:  no Thyroid disease: no  HIV: no  Alcoholism: no  Spinal cord injury: no Alleviating factors: nothing Aggravating factors: stress in past, but no triggers recently Status: exacerbated Treatments attempted:  Depakote  Relevant past medical, surgical, family and social history reviewed and updated as indicated. Interim medical history since our last visit reviewed. Allergies and medications reviewed and updated.  Review of Systems  Constitutional:  Negative for activity change, appetite change, diaphoresis, fatigue and fever.  Respiratory:  Negative for cough, chest tightness and shortness of breath.   Cardiovascular:  Negative for chest pain, palpitations and leg swelling.  Gastrointestinal: Negative.   Neurological:  Positive for seizures and weakness. Negative for dizziness, tremors, syncope, facial asymmetry, light-headedness and headaches.  Psychiatric/Behavioral:  Positive for sleep disturbance. Negative for decreased concentration, self-injury and suicidal ideas. The patient is nervous/anxious.     Per HPI unless specifically indicated above     Objective:    BP 115/82   Pulse 87   Wt 136 lb 6.4 oz (61.9 kg)   SpO2 99%   BMI 24.16 kg/m   Wt Readings from Last 3 Encounters:  07/12/23 136 lb 6.4 oz (61.9 kg)  07/10/23 132 lb 4.4 oz (60 kg)  07/05/23 133 lb (60.3 kg)    Physical Exam Vitals and nursing note reviewed.  Constitutional:  General: She is awake. She is not in acute distress.    Appearance: She is well-developed and well-groomed. She is not ill-appearing or toxic-appearing.  HENT:     Head: Normocephalic.     Right Ear: Hearing and external ear normal.     Left Ear: Hearing and external ear normal.  Eyes:     General: Lids are normal.        Right eye: No discharge.        Left eye: No discharge.     Extraocular Movements: Extraocular movements intact.     Conjunctiva/sclera: Conjunctivae normal.     Pupils: Pupils are equal, round, and reactive to  light.     Visual Fields: Right eye visual fields normal and left eye visual fields normal.  Neck:     Thyroid: No thyromegaly.     Vascular: No carotid bruit.  Cardiovascular:     Rate and Rhythm: Normal rate and regular rhythm.     Heart sounds: Normal heart sounds. No murmur heard.    No gallop.  Pulmonary:     Effort: Pulmonary effort is normal. No accessory muscle usage or respiratory distress.     Breath sounds: Normal breath sounds.  Abdominal:     General: Bowel sounds are normal. There is no distension.     Palpations: Abdomen is soft.     Tenderness: There is no abdominal tenderness.  Musculoskeletal:     Cervical back: Normal range of motion and neck supple.     Right lower leg: No edema.     Left lower leg: No edema.  Lymphadenopathy:     Cervical: No cervical adenopathy.  Skin:    General: Skin is warm and dry.  Neurological:     Mental Status: She is alert and oriented to person, place, and time.     Cranial Nerves: Cranial nerves 2-12 are intact.     Motor: Motor function is intact.     Coordination: Coordination is intact.     Gait: Gait is intact.     Deep Tendon Reflexes: Reflexes are normal and symmetric.     Reflex Scores:      Brachioradialis reflexes are 2+ on the right side and 2+ on the left side.      Patellar reflexes are 2+ on the right side and 2+ on the left side. Psychiatric:        Attention and Perception: Attention normal.        Mood and Affect: Mood normal.        Speech: Speech normal.        Behavior: Behavior normal. Behavior is cooperative.        Thought Content: Thought content normal.    Results for orders placed or performed during the hospital encounter of 07/10/23  Basic metabolic panel  Result Value Ref Range   Sodium 137 135 - 145 mmol/L   Potassium 4.0 3.5 - 5.1 mmol/L   Chloride 108 98 - 111 mmol/L   CO2 20 (L) 22 - 32 mmol/L   Glucose, Bld 90 70 - 99 mg/dL   BUN 10 6 - 20 mg/dL   Creatinine, Ser 0.45 0.44 - 1.00  mg/dL   Calcium 8.6 (L) 8.9 - 10.3 mg/dL   GFR, Estimated >40 >98 mL/min   Anion gap 9 5 - 15  POC Urine Pregnancy, ED  Result Value Ref Range   Preg Test, Ur Negative Negative      Assessment & Plan:  Problem List Items Addressed This Visit       Other   Bipolar 2 disorder (HCC)    Chronic, ongoing.  Followed by psychiatry.  Denies SI/HI. Continue medication regimen as prescribed by them, they are going to discuss changes today. Discussed with her at length and educated.  Has safety plan in place if suicidal ideation were to present, is aware to immediately go to ER if intrusive thoughts.       Seizure-like activity (HCC) - Primary    Currently with increase in activity over past week - ?related to increase in Vraylar dose recently.  She sees her psychiatrist this afternoon and they plan on discussing changing medications.  May benefit return to Depakote which worked well in past and avoidance of antipsychotics if possible.  Would like second opinion from neuro, will place referral to [facility].      Relevant Orders   Ambulatory referral to Neurology     Follow up plan: Return in about 4 weeks (around 08/09/2023) for SEIZURE-LIKE ACTIVITY.

## 2023-07-19 DIAGNOSIS — F4312 Post-traumatic stress disorder, chronic: Secondary | ICD-10-CM | POA: Diagnosis not present

## 2023-07-19 DIAGNOSIS — F3181 Bipolar II disorder: Secondary | ICD-10-CM | POA: Diagnosis not present

## 2023-07-19 DIAGNOSIS — F410 Panic disorder [episodic paroxysmal anxiety] without agoraphobia: Secondary | ICD-10-CM | POA: Diagnosis not present

## 2023-07-20 DIAGNOSIS — R569 Unspecified convulsions: Secondary | ICD-10-CM | POA: Diagnosis not present

## 2023-07-21 DIAGNOSIS — F4312 Post-traumatic stress disorder, chronic: Secondary | ICD-10-CM | POA: Diagnosis not present

## 2023-07-21 DIAGNOSIS — F410 Panic disorder [episodic paroxysmal anxiety] without agoraphobia: Secondary | ICD-10-CM | POA: Diagnosis not present

## 2023-07-21 DIAGNOSIS — F3181 Bipolar II disorder: Secondary | ICD-10-CM | POA: Diagnosis not present

## 2023-07-28 ENCOUNTER — Telehealth: Payer: Self-pay | Admitting: Nurse Practitioner

## 2023-07-28 NOTE — Telephone Encounter (Signed)
Paperwork was received this morning for the patient. Will start and then place in the providers folder for completion and signature.

## 2023-07-28 NOTE — Telephone Encounter (Unsigned)
Copied from CRM [id number]. Topic: General - Other >> Jul 28, 2023  8:34 AM Turkey B wrote: Reason for CRM: Connye Burkitt from [employer] short term disability, states they haven't received this paperwork for the pt

## 2023-07-31 NOTE — Telephone Encounter (Signed)
Forms started. Placed in providers folder for completion and signature.

## 2023-08-01 DIAGNOSIS — F410 Panic disorder [episodic paroxysmal anxiety] without agoraphobia: Secondary | ICD-10-CM | POA: Diagnosis not present

## 2023-08-01 DIAGNOSIS — F3181 Bipolar II disorder: Secondary | ICD-10-CM | POA: Diagnosis not present

## 2023-08-01 DIAGNOSIS — F4312 Post-traumatic stress disorder, chronic: Secondary | ICD-10-CM | POA: Diagnosis not present

## 2023-08-01 NOTE — Telephone Encounter (Signed)
Paperwork has been completed and faxed to [facility] Short Term [employer]ability.

## 2023-08-02 DIAGNOSIS — F3181 Bipolar II disorder: Secondary | ICD-10-CM | POA: Diagnosis not present

## 2023-08-02 DIAGNOSIS — F4312 Post-traumatic stress disorder, chronic: Secondary | ICD-10-CM | POA: Diagnosis not present

## 2023-08-02 DIAGNOSIS — F410 Panic disorder [episodic paroxysmal anxiety] without agoraphobia: Secondary | ICD-10-CM | POA: Diagnosis not present

## 2023-08-05 NOTE — Patient Instructions (Signed)
Seizure, Adult A seizure is a sudden burst of abnormal electrical and chemical activity in the brain. Seizures usually last from 30 seconds to 2 minutes.  What are the causes? Common causes of this condition include: Fever or infection. Problems that affect the brain. These may include: A brain or head injury. Bleeding in the brain. A brain tumor. Low levels of blood sugar or salt. Kidney problems or liver problems. Conditions that are passed from parent to child (are inherited). Problems with a substance, such as: Having a reaction to a drug or a medicine. Stopping the use of a substance all of a sudden (withdrawal). A stroke. Disorders that affect how you develop. Sometimes, the cause may not be known.  What increases the risk? Having someone in your family who has epilepsy. In this condition, seizures happen again and again over time. They have no clear cause. Having had a tonic-clonic seizure before. This type of seizure causes you to: Tighten the muscles of the whole body. Lose consciousness. Having had a head injury or strokes before. Having had a lack of oxygen at birth. What are the signs or symptoms? There are many types of seizures. The symptoms vary depending on the type of seizure you have. Symptoms during a seizure Shaking that you cannot control (convulsions) with fast, jerky movements of muscles. Stiffness of the body. Breathing problems. Feeling mixed up (confused). Staring or not responding to sound or touch. Head nodding. Eyes that blink, flutter, or move fast. Drooling, grunting, or making clicking sounds with your mouth Losing control of when you pee or poop. Symptoms before a seizure Feeling afraid, nervous, or worried. Feeling like you may vomit. Feeling like: You are moving when you are not. Things around you are moving when they are not. Feeling like you saw or heard something before (dj vu). Odd tastes or smells. Changes in how you see. You may  see flashing lights or spots. Symptoms after a seizure Feeling confused. Feeling sleepy. Headache. Sore muscles. How is this treated? If your seizure stops on its own, you will not need treatment. If your seizure lasts longer than 5 minutes, you will normally need treatment. Treatment may include: Medicines given through an IV tube. Avoiding things, such as medicines, that are known to cause your seizures. Medicines to prevent seizures. A device to prevent or control seizures. Surgery. A diet low in carbohydrates and high in fat (ketogenic diet). Follow these instructions at home: Medicines Take over-the-counter and prescription medicines only as told by your doctor. Avoid foods or drinks that may keep your medicine from working, such as alcohol. Activity Follow instructions about driving, swimming, or doing things that would be dangerous if you had another seizure. Wait until your doctor says it is safe for you to do these things. If you live in the [place] ask your local department of motor vehicles when you can drive. Get a lot of rest. Teaching others  Teach friends and family what to do when you have a seizure. They should: Help you get down to the ground. Protect your head and body. Loosen any clothing around your neck. Turn you on your side. Know whether or not you need emergency care. Stay with you until you are better. Also, tell them what not to do if you have a seizure. Tell them: They should not hold you down. They should not put anything in your mouth. General instructions Avoid anything that gives you seizures. Keep a seizure diary. Write down: What you remember  about each seizure. What you think caused each seizure. Keep all follow-up visits. Contact a doctor if: You have another seizure or seizures. Call the doctor each time you have a seizure. The pattern of your seizures changes. You keep having seizures with treatment. You have symptoms of being sick or  having an infection. You are not able to take your medicine. Get help right away if: You have any of these problems: A seizure that lasts longer than 5 minutes. Many seizures in a row and you do not feel better between seizures. A seizure that makes it harder to breathe. A seizure and you can no longer speak or use part of your body. You do not wake up right after a seizure. You get hurt during a seizure. You feel confused or have pain right after a seizure. These symptoms may be an emergency. Get help right away. Call your local emergency services (911 in the [place] Do not wait to see if the symptoms will go away. Do not drive yourself to the hospital. Summary A seizure is a sudden burst of abnormal electrical and chemical activity in the brain. Seizures normally last from 30 seconds to 2 minutes. Causes of seizures include illness, injury to the head, low levels of blood sugar or salt, and certain conditions. Most seizures will stop on their own in less than 5 minutes. Seizures that last longer than 5 minutes are a medical emergency and need treatment right away. Many medicines are used to treat seizures. Take over-the-counter and prescription medicines only as told by your doctor. This information is not intended to replace advice given to you by your health care provider. Make sure you discuss any questions you have with your health care provider. Document Revised: 05/06/2020 Document Reviewed: 05/08/2020 Elsevier Patient Education  2024 [employer].

## 2023-08-09 ENCOUNTER — Ambulatory Visit: Payer: Medicaid Other | Admitting: Nurse Practitioner

## 2023-08-09 ENCOUNTER — Encounter: Payer: Self-pay | Admitting: Nurse Practitioner

## 2023-08-09 VITALS — BP 103/69 | HR 80 | Temp 98.0°F | Wt 143.6 lb

## 2023-08-09 DIAGNOSIS — F410 Panic disorder [episodic paroxysmal anxiety] without agoraphobia: Secondary | ICD-10-CM | POA: Diagnosis not present

## 2023-08-09 DIAGNOSIS — F3181 Bipolar II disorder: Secondary | ICD-10-CM | POA: Diagnosis not present

## 2023-08-09 DIAGNOSIS — F319 Bipolar disorder, unspecified: Secondary | ICD-10-CM | POA: Diagnosis not present

## 2023-08-09 DIAGNOSIS — R569 Unspecified convulsions: Secondary | ICD-10-CM

## 2023-08-09 DIAGNOSIS — Z888 Allergy status to other drugs, medicaments and biological substances status: Secondary | ICD-10-CM | POA: Diagnosis not present

## 2023-08-09 DIAGNOSIS — Z79899 Other long term (current) drug therapy: Secondary | ICD-10-CM | POA: Diagnosis not present

## 2023-08-09 NOTE — Progress Notes (Signed)
BP 103/69   Pulse 80   Temp 98 F (36.7 C) (Oral)   Wt 143 lb 9.6 oz (65.1 kg)   SpO2 98%   BMI 25.44 kg/m    Subjective:    Patient ID: Erica Long, female    DOB: 25-Aug-2001, 22 y.o.   MRN: [id number]  HPI: Erica Long is a 22 y.o. female  Chief Complaint  Patient presents with   Seizures   SEIZURE-LIKE ACTIVITY Follow-up today for seizures.  She was seen by neuro initially 02/09/21 for this and had overall reassuring work-up and it was suspected some of this could be from mood.  Had recent initial visit with [facility] neurology on 07/20/23 and has EEG being performed in hospital 08/09/23. They increased Depakote to 250 MG BID.  Has had no recent seizure activity. Overall is feeling better.  She is followed by psychiatry [facility]), continues on Vraylar with benefit to her Bipolar 2 Disorder + has Ativan to take as needed and Atarax but these are offering no benefit.  She was taken off Depakote in past due to light headed feeling in mornings, but continued to have this off Depakote.  Is tolerating well now. Weakness: no Trauma: no Recent illness: no Diabetes: no Thyroid disease: no  HIV: no  Alcoholism: no  Spinal cord injury: no Alleviating factors: nothing Aggravating factors: stress in past, but none recently Status: stable Treatments attempted:  Depakote    08/09/2023   10:16 AM 07/12/2023    9:58 AM 05/30/2023    4:34 PM 03/09/2023    9:54 AM 12/21/2022    8:48 AM  Depression screen PHQ 2/9  Decreased Interest 2 2 2 3 1   Down, Depressed, Hopeless 1 1 2 3 3   PHQ - 2 Score 3 3 4 6 4   Altered sleeping 1 3 3 3 2   Tired, decreased energy 2 3 3 2 2   Change in appetite 3 3 3 2 2   Feeling bad or failure about yourself  2 3 3 3 3   Trouble concentrating 1 3 1 2 1   Moving slowly or fidgety/restless 2 3 1 2 2   Suicidal thoughts 0 0 0 1 1  PHQ-9 Score 14 21 18 21 17   Difficult doing work/chores Somewhat difficult Very difficult Somewhat difficult Extremely dIfficult  Somewhat difficult       08/09/2023   10:16 AM 07/12/2023    9:58 AM 05/30/2023    4:35 PM 03/09/2023    9:54 AM  GAD 7 : Generalized Anxiety Score  Nervous, Anxious, on Edge 2 3 2 3   Control/stop worrying 3 3 3 3   Worry too much - different things 3 3 3 3   Trouble relaxing 1 3 1 2   Restless 1 3 0 1  Easily annoyed or irritable 2 1 2 2   Afraid - awful might happen 1 1 1 2   Total GAD 7 Score 13 17 12 16   Anxiety Difficulty Somewhat difficult Very difficult Somewhat difficult Very difficult   Relevant past medical, surgical, family and social history reviewed and updated as indicated. Interim medical history since our last visit reviewed. Allergies and medications reviewed and updated.  Review of Systems  Constitutional:  Negative for activity change, appetite change, diaphoresis, fatigue and fever.  Respiratory:  Negative for cough, chest tightness and shortness of breath.   Cardiovascular:  Negative for chest pain, palpitations and leg swelling.  Gastrointestinal: Negative.   Neurological:  Positive for seizures (none since last visit). Negative for  dizziness, tremors, syncope, facial asymmetry, weakness, light-headedness and headaches.  Psychiatric/Behavioral:  Negative for decreased concentration, self-injury, sleep disturbance and suicidal ideas. The patient is nervous/anxious.     Per HPI unless specifically indicated above     Objective:    BP 103/69   Pulse 80   Temp 98 F (36.7 C) (Oral)   Wt 143 lb 9.6 oz (65.1 kg)   SpO2 98%   BMI 25.44 kg/m   Wt Readings from Last 3 Encounters:  08/09/23 143 lb 9.6 oz (65.1 kg)  07/12/23 136 lb 6.4 oz (61.9 kg)  07/10/23 132 lb 4.4 oz (60 kg)    Physical Exam Vitals and nursing note reviewed.  Constitutional:      General: She is awake. She is not in acute distress.    Appearance: She is well-developed and well-groomed. She is not ill-appearing or toxic-appearing.  HENT:     Head: Normocephalic.     Right Ear: Hearing and  external ear normal.     Left Ear: Hearing and external ear normal.  Eyes:     General: Lids are normal.        Right eye: No discharge.        Left eye: No discharge.     Extraocular Movements: Extraocular movements intact.     Conjunctiva/sclera: Conjunctivae normal.     Pupils: Pupils are equal, round, and reactive to light.     Visual Fields: Right eye visual fields normal and left eye visual fields normal.  Neck:     Thyroid: No thyromegaly.     Vascular: No carotid bruit.  Cardiovascular:     Rate and Rhythm: Normal rate and regular rhythm.     Heart sounds: Normal heart sounds. No murmur heard.    No gallop.  Pulmonary:     Effort: Pulmonary effort is normal. No accessory muscle usage or respiratory distress.     Breath sounds: Normal breath sounds.  Abdominal:     General: Bowel sounds are normal. There is no distension.     Palpations: Abdomen is soft.     Tenderness: There is no abdominal tenderness.  Musculoskeletal:     Cervical back: Normal range of motion and neck supple.     Right lower leg: No edema.     Left lower leg: No edema.  Lymphadenopathy:     Cervical: No cervical adenopathy.  Skin:    General: Skin is warm and dry.  Neurological:     Mental Status: She is alert and oriented to person, place, and time.     Cranial Nerves: Cranial nerves 2-12 are intact.     Motor: Motor function is intact.     Coordination: Coordination is intact.     Gait: Gait is intact.     Deep Tendon Reflexes: Reflexes are normal and symmetric.     Reflex Scores:      Brachioradialis reflexes are 2+ on the right side and 2+ on the left side.      Patellar reflexes are 2+ on the right side and 2+ on the left side. Psychiatric:        Attention and Perception: Attention normal.        Mood and Affect: Mood normal.        Speech: Speech normal.        Behavior: Behavior normal. Behavior is cooperative.        Thought Content: Thought content normal.    Results for orders  placed or  performed during the hospital encounter of 07/10/23  Basic metabolic panel  Result Value Ref Range   Sodium 137 135 - 145 mmol/L   Potassium 4.0 3.5 - 5.1 mmol/L   Chloride 108 98 - 111 mmol/L   CO2 20 (L) 22 - 32 mmol/L   Glucose, Bld 90 70 - 99 mg/dL   BUN 10 6 - 20 mg/dL   Creatinine, Ser 1.61 0.44 - 1.00 mg/dL   Calcium 8.6 (L) 8.9 - 10.3 mg/dL   GFR, Estimated >09 >60 mL/min   Anion gap 9 5 - 15  POC Urine Pregnancy, ED  Result Value Ref Range   Preg Test, Ur Negative Negative      Assessment & Plan:   Problem List Items Addressed This Visit       Other   Bipolar 2 disorder (HCC)    Chronic, ongoing.  Followed by psychiatry.  Denies SI/HI. Continue medication regimen as prescribed by them.  Discussed with her at length and educated.  Has safety plan in place if suicidal ideation were to present, is aware to immediately go to ER if intrusive thoughts.       Seizure-like activity (HCC) - Primary    Ongoing and stable at present with no recent seizures.  She is being followed by [facility] neurology and has EEG admission today.  Continue this collaboration and Depakote as ordered.         Follow up plan: Return for as scheduled on December 9th.

## 2023-08-09 NOTE — Assessment & Plan Note (Signed)
Chronic, ongoing.  Followed by psychiatry.  Denies SI/HI. Continue medication regimen as prescribed by them.  Discussed with her at length and educated.  Has safety plan in place if suicidal ideation were to present, is aware to immediately go to ER if intrusive thoughts.

## 2023-08-09 NOTE — Assessment & Plan Note (Signed)
Ongoing and stable at present with no recent seizures.  She is being followed by [facility] neurology and has EEG admission today.  Continue this collaboration and Depakote as ordered.

## 2023-08-10 DIAGNOSIS — Z79899 Other long term (current) drug therapy: Secondary | ICD-10-CM | POA: Diagnosis not present

## 2023-08-10 DIAGNOSIS — F410 Panic disorder [episodic paroxysmal anxiety] without agoraphobia: Secondary | ICD-10-CM | POA: Diagnosis not present

## 2023-08-10 DIAGNOSIS — Z888 Allergy status to other drugs, medicaments and biological substances status: Secondary | ICD-10-CM | POA: Diagnosis not present

## 2023-08-10 DIAGNOSIS — F319 Bipolar disorder, unspecified: Secondary | ICD-10-CM | POA: Diagnosis not present

## 2023-08-10 DIAGNOSIS — R569 Unspecified convulsions: Secondary | ICD-10-CM | POA: Diagnosis not present

## 2023-08-11 DIAGNOSIS — F410 Panic disorder [episodic paroxysmal anxiety] without agoraphobia: Secondary | ICD-10-CM | POA: Diagnosis not present

## 2023-08-11 DIAGNOSIS — F419 Anxiety disorder, unspecified: Secondary | ICD-10-CM | POA: Diagnosis not present

## 2023-08-11 DIAGNOSIS — Z888 Allergy status to other drugs, medicaments and biological substances status: Secondary | ICD-10-CM | POA: Diagnosis not present

## 2023-08-11 DIAGNOSIS — Z79899 Other long term (current) drug therapy: Secondary | ICD-10-CM | POA: Diagnosis not present

## 2023-08-11 DIAGNOSIS — R569 Unspecified convulsions: Secondary | ICD-10-CM | POA: Diagnosis not present

## 2023-08-11 DIAGNOSIS — F319 Bipolar disorder, unspecified: Secondary | ICD-10-CM | POA: Diagnosis not present

## 2023-08-14 ENCOUNTER — Telehealth: Payer: Self-pay | Admitting: Nurse Practitioner

## 2023-08-14 NOTE — Telephone Encounter (Signed)
Copied from CRM [id number]. Topic: General - Other >> Aug 14, 2023 11:56 AM Erica Long wrote: Reason for CRM: The patient has called to provide an updated fax number to submit their short term disability paperwork to do  Please submit paperwork via fax to [phone]

## 2023-08-17 NOTE — Telephone Encounter (Signed)
Called patient to clarify what it is that she needs from our office. Patient says she needed a copy of her most recent office note from visit 08/09/23 and a copy of her work note. Patient is asking if there is anyway the provider can write today's date on the letter dated back to 07/12/23. Please advise?

## 2023-08-18 ENCOUNTER — Encounter: Payer: Self-pay | Admitting: Nurse Practitioner

## 2023-08-23 ENCOUNTER — Ambulatory Visit: Payer: Medicaid Other | Admitting: Nurse Practitioner

## 2023-09-04 DIAGNOSIS — F410 Panic disorder [episodic paroxysmal anxiety] without agoraphobia: Secondary | ICD-10-CM | POA: Diagnosis not present

## 2023-09-04 DIAGNOSIS — F4312 Post-traumatic stress disorder, chronic: Secondary | ICD-10-CM | POA: Diagnosis not present

## 2023-09-04 DIAGNOSIS — F3181 Bipolar II disorder: Secondary | ICD-10-CM | POA: Diagnosis not present

## 2023-09-06 ENCOUNTER — Ambulatory Visit: Payer: Medicaid Other | Admitting: Nurse Practitioner

## 2023-09-09 NOTE — Patient Instructions (Signed)
 Managing Anxiety, Adult  After being diagnosed with anxiety, you may be relieved to know why you have felt or behaved a certain way. You may also feel overwhelmed about the treatment ahead and what it will mean for your life. With care and support, you can manage your anxiety.  How to manage lifestyle changes  Understanding the difference between stress and anxiety  Although stress can play a role in anxiety, it is not the same as anxiety. Stress is your body's reaction to life changes and events, both good and bad. Stress is often caused by something external, such as a deadline, test, or competition. It normally goes away after the event has ended and will last just a few hours. But, stress can be ongoing and can lead to more than just stress.  Anxiety is caused by something internal, such as imagining a terrible outcome or worrying that something will go wrong that will greatly upset you. Anxiety often does not go away even after the event is over, and it can become a long-term (chronic) worry.  Lowering stress and anxiety    Talk with your health care provider or a counselor to learn more about lowering anxiety and stress. They may suggest tension-reduction techniques, such as:  Music. Spend time creating or listening to music that you enjoy and that inspires you.  Mindfulness-based meditation. Practice being aware of your normal breaths while not trying to control your breathing. It can be done while sitting or walking.  Centering prayer. Focus on a word, phrase, or sacred image that means something to you and brings you peace.  Deep breathing. Expand your stomach and inhale slowly through your nose. Hold your breath for 3-5 seconds. Then breathe out slowly, letting your stomach muscles relax.  Self-talk. Learn to notice and spot thought patterns that lead to anxiety reactions. Change those patterns to thoughts that feel peaceful.  Muscle relaxation. Take time to tense muscles and then relax them.  Choose a  tension-reduction technique that fits your lifestyle and personality. These techniques take time and practice. Set aside 5-15 minutes a day to do them. Specialized therapists can offer counseling and training in these techniques. The training to help with anxiety may be covered by some insurance plans.  Other things you can do to manage stress and anxiety include:  Keeping a stress diary. This can help you learn what triggers your reaction and then learn ways to manage your response.  Thinking about how you react to certain situations. You may not be able to control everything, but you can control your response.  Making time for activities that help you relax and not feeling guilty about spending your time in this way.  Doing visual imagery. This involves imagining or creating mental pictures to help you relax.  Practicing yoga. Through yoga poses, you can lower tension and relax.     Medicines  Medicines for anxiety include:  Antidepressant medicines. These are usually prescribed for long-term daily control.  Anti-anxiety medicines. These may be added in severe cases, especially when panic attacks occur.  When used together, medicines, psychotherapy, and tension-reduction techniques may be the most effective treatment.  Relationships  Relationships can play a big part in helping you recover. Spend more time connecting with trusted friends and family members. Think about going to couples counseling if you have a partner, taking family education classes, or going to family therapy. Therapy can help you and others better understand your anxiety.  How to recognize changes in  your anxiety  Everyone responds differently to treatment for anxiety. Recovery from anxiety happens when symptoms lessen and stop interfering with your daily life at home or work. This may mean that you will start to:  Have better concentration and focus. Worry will interfere less in your daily thinking.  Sleep better.  Be less irritable.  Have  more energy.  Have improved memory.  Try to recognize when your condition is getting worse. Contact your provider if your symptoms interfere with home or work and you feel like your condition is not improving.  Follow these instructions at home:  Activity  Exercise. Adults should:  Exercise for at least 150 minutes each week. The exercise should increase your heart rate and make you sweat (moderate-intensity exercise).  Do strengthening exercises at least twice a week.  Get the right amount and quality of sleep. Most adults need 7-9 hours of sleep each night.  Lifestyle    Eat a healthy diet that includes plenty of vegetables, fruits, whole grains, low-fat dairy products, and lean protein.  Do not eat a lot of foods that are high in fats, added sugars, or salt (sodium).  Make choices that simplify your life.  Do not use any products that contain nicotine or tobacco. These products include cigarettes, chewing tobacco, and vaping devices, such as e-cigarettes. If you need help quitting, ask your provider.  Avoid caffeine, alcohol, and certain over-the-counter cold medicines. These may make you feel worse. Ask your pharmacist which medicines to avoid.  General instructions  Take over-the-counter and prescription medicines only as told by your provider.  Keep all follow-up visits. This is to make sure you are managing your anxiety well or if you need more support.  Where to find support  You can get help and support from:  Self-help groups.  Online and [employer].  A trusted spiritual leader.  Couples counseling.  Family education classes.  Family therapy.  Where to find more information  You may find that joining a support group helps you deal with your anxiety. The following sources can help you find counselors or support groups near you:  Mental Health [place]: mentalhealthamerica.net  Anxiety and Depression Association of [place] (ADAA): adaa.org  [employer] Mental Illness (NAMI):  nami.org  Contact a health care provider if:  You have a hard time staying focused or finishing tasks.  You spend many hours a day feeling worried about everyday life.  You are very tired because you cannot stop worrying.  You start to have headaches or often feel tense.  You have chronic nausea or diarrhea.  Get help right away if:  Your heart feels like it is racing.  You have shortness of breath.  You have thoughts of hurting yourself or others.  Get help right away if you feel like you may hurt yourself or others, or have thoughts about taking your own life. Go to your nearest emergency room or:  Call 911.  Call the National Suicide Prevention Lifeline at 417-380-0019 or 988. This is open 24 hours a day.  Text the Crisis Text Line at 4151481703.  This information is not intended to replace advice given to you by your health care provider. Make sure you discuss any questions you have with your health care provider.  Document Revised: 08/09/2022 Document Reviewed: 02/21/2021  Elsevier Patient Education  2024 [employer].

## 2023-09-11 ENCOUNTER — Encounter: Payer: Self-pay | Admitting: Nurse Practitioner

## 2023-09-11 ENCOUNTER — Ambulatory Visit: Payer: Medicaid Other | Admitting: Nurse Practitioner

## 2023-09-11 VITALS — BP 131/78 | HR 91 | Temp 97.7°F | Ht 63.0 in | Wt 145.4 lb

## 2023-09-11 DIAGNOSIS — F3181 Bipolar II disorder: Secondary | ICD-10-CM | POA: Diagnosis not present

## 2023-09-11 DIAGNOSIS — F410 Panic disorder [episodic paroxysmal anxiety] without agoraphobia: Secondary | ICD-10-CM | POA: Diagnosis not present

## 2023-09-11 DIAGNOSIS — Z789 Other specified health status: Secondary | ICD-10-CM | POA: Diagnosis not present

## 2023-09-11 DIAGNOSIS — Z113 Encounter for screening for infections with a predominantly sexual mode of transmission: Secondary | ICD-10-CM

## 2023-09-11 LAB — PREGNANCY, URINE: Preg Test, Ur: NEGATIVE

## 2023-09-11 NOTE — Progress Notes (Signed)
BP 131/78 (BP Location: Left Arm, Patient Position: Sitting, Cuff Size: Normal)   Pulse 91   Temp 97.7 F (36.5 C) (Oral)   Ht 5\' 3"  (1.6 m)   Wt 145 lb 6.4 oz (66 kg)   SpO2 99%   BMI 25.76 kg/m    Subjective:    Patient ID: Erica Long, female    DOB: 07/09/2001, 22 y.o.   MRN: [id number]  HPI: Erica Long is a 22 y.o. female  Chief Complaint  Patient presents with   blood work    Lipid, liver and Depakote levels, would like to discuss if she can get Depo earlier, she's due 11/1-11/15   Would like STD testing today.  DEPRESSION Currently taking Depakote, Ativan, Vraylar.  Followed by psychiatry.  They are in need of labs for monitoring. Lipid, CMP, and Depakote levels needed. Mood status: ongoing Satisfied with current treatment?: yes Symptom severity: moderate  Duration of current treatment : chronic Side effects: no Medication compliance: good compliance Psychotherapy/counseling: yes in past Depressed mood: yes Anxious mood: yes Anhedonia: no Significant weight loss or gain: no Insomnia: yes hard to fall asleep Fatigue: yes Feelings of worthlessness or guilt: no Impaired concentration/indecisiveness: no Suicidal ideations: no Hopelessness: no Crying spells: no    09/11/2023    2:35 PM 08/09/2023   10:16 AM 07/12/2023    9:58 AM 05/30/2023    4:34 PM 03/09/2023    9:54 AM  Depression screen PHQ 2/9  Decreased Interest 1 2 2 2 3   Down, Depressed, Hopeless 1 1 1 2 3   PHQ - 2 Score 2 3 3 4 6   Altered sleeping 3 1 3 3 3   Tired, decreased energy 3 2 3 3 2   Change in appetite 3 3 3 3 2   Feeling bad or failure about yourself  2 2 3 3 3   Trouble concentrating 2 1 3 1 2   Moving slowly or fidgety/restless 3 2 3 1 2   Suicidal thoughts 0 0 0 0 1  PHQ-9 Score 18 14 21 18 21   Difficult doing work/chores  Somewhat difficult Very difficult Somewhat difficult Extremely dIfficult       09/11/2023    2:35 PM 08/09/2023   10:16 AM 07/12/2023    9:58 AM 05/30/2023     4:35 PM  GAD 7 : Generalized Anxiety Score  Nervous, Anxious, on Edge 3 2 3 2   Control/stop worrying 3 3 3 3   Worry too much - different things 3 3 3 3   Trouble relaxing 2 1 3 1   Restless 1 1 3  0  Easily annoyed or irritable 1 2 1 2   Afraid - awful might happen 2 1 1 1   Total GAD 7 Score 15 13 17 12   Anxiety Difficulty  Somewhat difficult Very difficult Somewhat difficult   Relevant past medical, surgical, family and social history reviewed and updated as indicated. Interim medical history since our last visit reviewed. Allergies and medications reviewed and updated.  Review of Systems  Constitutional:  Negative for activity change, appetite change, diaphoresis, fatigue and fever.  Respiratory:  Negative for cough, chest tightness and shortness of breath.   Cardiovascular:  Negative for chest pain, palpitations and leg swelling.  Gastrointestinal: Negative.   Neurological: Negative.   Psychiatric/Behavioral:  Positive for sleep disturbance. Negative for decreased concentration, self-injury and suicidal ideas. The patient is nervous/anxious.     Per HPI unless specifically indicated above     Objective:    BP 131/78 (BP  Location: Left Arm, Patient Position: Sitting, Cuff Size: Normal)   Pulse 91   Temp 97.7 F (36.5 C) (Oral)   Ht 5\' 3"  (1.6 m)   Wt 145 lb 6.4 oz (66 kg)   SpO2 99%   BMI 25.76 kg/m   Wt Readings from Last 3 Encounters:  09/11/23 145 lb 6.4 oz (66 kg)  08/09/23 143 lb 9.6 oz (65.1 kg)  07/12/23 136 lb 6.4 oz (61.9 kg)    Physical Exam Vitals and nursing note reviewed.  Constitutional:      General: She is awake. She is not in acute distress.    Appearance: She is well-developed and well-groomed. She is not ill-appearing or toxic-appearing.  HENT:     Head: Normocephalic.     Right Ear: Hearing and external ear normal.     Left Ear: Hearing and external ear normal.  Eyes:     General: Lids are normal.        Right eye: No discharge.        Left  eye: No discharge.     Extraocular Movements: Extraocular movements intact.     Conjunctiva/sclera: Conjunctivae normal.     Pupils: Pupils are equal, round, and reactive to light.     Visual Fields: Right eye visual fields normal and left eye visual fields normal.  Neck:     Thyroid: No thyromegaly.     Vascular: No carotid bruit.  Cardiovascular:     Rate and Rhythm: Normal rate and regular rhythm.     Heart sounds: Normal heart sounds. No murmur heard.    No gallop.  Pulmonary:     Effort: Pulmonary effort is normal. No accessory muscle usage or respiratory distress.     Breath sounds: Normal breath sounds.  Abdominal:     General: Bowel sounds are normal. There is no distension.     Palpations: Abdomen is soft.     Tenderness: There is no abdominal tenderness.  Musculoskeletal:     Cervical back: Normal range of motion and neck supple.     Right lower leg: No edema.     Left lower leg: No edema.  Lymphadenopathy:     Cervical: No cervical adenopathy.  Skin:    General: Skin is warm and dry.  Neurological:     Mental Status: She is alert and oriented to person, place, and time.     Cranial Nerves: Cranial nerves 2-12 are intact.     Motor: Motor function is intact.     Coordination: Coordination is intact.     Gait: Gait is intact.     Deep Tendon Reflexes: Reflexes are normal and symmetric.     Reflex Scores:      Brachioradialis reflexes are 2+ on the right side and 2+ on the left side.      Patellar reflexes are 2+ on the right side and 2+ on the left side. Psychiatric:        Attention and Perception: Attention normal.        Mood and Affect: Mood normal.        Speech: Speech normal.        Behavior: Behavior normal. Behavior is cooperative.        Thought Content: Thought content normal.     Results for orders placed or performed during the hospital encounter of 07/10/23  Basic metabolic panel  Result Value Ref Range   Sodium 137 135 - 145 mmol/L   Potassium  4.0 3.5 -  5.1 mmol/L   Chloride 108 98 - 111 mmol/L   CO2 20 (L) 22 - 32 mmol/L   Glucose, Bld 90 70 - 99 mg/dL   BUN 10 6 - 20 mg/dL   Creatinine, Ser 1.61 0.44 - 1.00 mg/dL   Calcium 8.6 (L) 8.9 - 10.3 mg/dL   GFR, Estimated >09 >60 mL/min   Anion gap 9 5 - 15  POC Urine Pregnancy, ED  Result Value Ref Range   Preg Test, Ur Negative Negative      Assessment & Plan:   Problem List Items Addressed This Visit       Other   Bipolar 2 disorder (HCC) - Primary    Chronic, ongoing.  Followed by psychiatry.  Denies SI/HI. Continue medication regimen as prescribed by them.  Discussed with her at length and educated.  Has safety plan in place if suicidal ideation were to present, is aware to immediately go to ER if intrusive thoughts. Labs today per psychiatry request.      Relevant Orders   Lipid Panel w/o Chol/HDL Ratio   Comprehensive metabolic panel   Valproic Acid level   Panic disorder (episodic paroxysmal anxiety)    Refer to Bipolar plan of care.      Uses Depo-Provera as primary birth control method    Urine pregnancy testing today.  Check CMP and lipid panel.  Depo provided today.      Relevant Orders   Pregnancy, urine   Other Visit Diagnoses     Screen for STD (sexually transmitted disease)       Would like full STD screening, including to see HSV levels.   Relevant Orders   GC/Chlamydia Probe Amp   HIV Antibody (routine testing w rflx)   RPR   HSV 1 and 2 Ab, IgG        Follow up plan: Return as scheduled in December.

## 2023-09-11 NOTE — Assessment & Plan Note (Signed)
Chronic, ongoing.  Followed by psychiatry.  Denies SI/HI. Continue medication regimen as prescribed by them.  Discussed with her at length and educated.  Has safety plan in place if suicidal ideation were to present, is aware to immediately go to ER if intrusive thoughts. Labs today per psychiatry request.

## 2023-09-11 NOTE — Assessment & Plan Note (Signed)
Urine pregnancy testing today.  Check CMP and lipid panel.  Depo provided today.

## 2023-09-11 NOTE — Assessment & Plan Note (Signed)
Refer to Bipolar plan of care. 

## 2023-09-12 LAB — COMPREHENSIVE METABOLIC PANEL
ALT: 15 [IU]/L (ref 0–32)
AST: 18 [IU]/L (ref 0–40)
Albumin: 4.9 g/dL (ref 4.0–5.0)
Alkaline Phosphatase: 56 [IU]/L (ref 44–121)
BUN/Creatinine Ratio: 9 (ref 9–23)
BUN: 8 mg/dL (ref 6–20)
Bilirubin Total: 0.3 mg/dL (ref 0.0–1.2)
CO2: 21 mmol/L (ref 20–29)
Calcium: 9.8 mg/dL (ref 8.7–10.2)
Chloride: 102 mmol/L (ref 96–106)
Creatinine, Ser: 0.85 mg/dL (ref 0.57–1.00)
Globulin, Total: 2.9 g/dL (ref 1.5–4.5)
Glucose: 93 mg/dL (ref 70–99)
Potassium: 4.2 mmol/L (ref 3.5–5.2)
Sodium: 140 mmol/L (ref 134–144)
Total Protein: 7.8 g/dL (ref 6.0–8.5)
eGFR: 99 mL/min/{1.73_m2} (ref >59)

## 2023-09-12 LAB — LIPID PANEL W/O CHOL/HDL RATIO
Cholesterol, Total: 185 mg/dL (ref 100–199)
HDL: 75 mg/dL (ref >39)
LDL Chol Calc (NIH): 98 mg/dL (ref 0–99)
Triglycerides: 61 mg/dL (ref 0–149)
VLDL Cholesterol Cal: 12 mg/dL (ref 5–40)

## 2023-09-12 LAB — HSV 1 AND 2 AB, IGG
HSV 1 Glycoprotein G Ab, IgG: NONREACTIVE
HSV 2 IgG, Type Spec: REACTIVE — AB

## 2023-09-12 LAB — RPR: RPR Ser Ql: NONREACTIVE

## 2023-09-12 LAB — HIV ANTIBODY (ROUTINE TESTING W REFLEX): HIV Screen 4th Generation wRfx: NONREACTIVE

## 2023-09-12 LAB — VALPROIC ACID LEVEL: Valproic Acid Lvl: 32 ug/mL — ABNORMAL LOW (ref 50–100)

## 2023-09-12 NOTE — Progress Notes (Signed)
Contacted via MyChart    Good morning Erica Long, just waiting on Valproic level to return.  Remainder of labs are stable.  Herpes 2 is still positive.  Any questions? Keep being stellar!!  Thank you for allowing me to participate in your care.  I appreciate you. Kindest regards, Selig Wampole

## 2023-09-13 DIAGNOSIS — F3181 Bipolar II disorder: Secondary | ICD-10-CM | POA: Diagnosis not present

## 2023-09-13 DIAGNOSIS — F4312 Post-traumatic stress disorder, chronic: Secondary | ICD-10-CM | POA: Diagnosis not present

## 2023-09-13 DIAGNOSIS — F410 Panic disorder [episodic paroxysmal anxiety] without agoraphobia: Secondary | ICD-10-CM | POA: Diagnosis not present

## 2023-09-14 LAB — GC/CHLAMYDIA PROBE AMP
Chlamydia trachomatis, NAA: NEGATIVE
Neisseria Gonorrhoeae by PCR: NEGATIVE

## 2023-09-14 NOTE — Progress Notes (Signed)
Contacted via MyChart   Urine result negative

## 2023-09-15 ENCOUNTER — Ambulatory Visit: Payer: Medicaid Other | Admitting: Nurse Practitioner

## 2023-09-15 DIAGNOSIS — S199XXA Unspecified injury of neck, initial encounter: Secondary | ICD-10-CM | POA: Diagnosis not present

## 2023-09-15 DIAGNOSIS — S01312A Laceration without foreign body of left ear, initial encounter: Secondary | ICD-10-CM | POA: Diagnosis not present

## 2023-09-15 DIAGNOSIS — S01412A Laceration without foreign body of left cheek and temporomandibular area, initial encounter: Secondary | ICD-10-CM | POA: Diagnosis not present

## 2023-09-15 DIAGNOSIS — M25551 Pain in right hip: Secondary | ICD-10-CM | POA: Diagnosis not present

## 2023-09-15 DIAGNOSIS — S299XXA Unspecified injury of thorax, initial encounter: Secondary | ICD-10-CM | POA: Diagnosis not present

## 2023-09-15 DIAGNOSIS — S0003XA Contusion of scalp, initial encounter: Secondary | ICD-10-CM | POA: Diagnosis not present

## 2023-09-15 DIAGNOSIS — S3993XA Unspecified injury of pelvis, initial encounter: Secondary | ICD-10-CM | POA: Diagnosis not present

## 2023-09-15 DIAGNOSIS — S3992XA Unspecified injury of lower back, initial encounter: Secondary | ICD-10-CM | POA: Diagnosis not present

## 2023-09-15 DIAGNOSIS — R0689 Other abnormalities of breathing: Secondary | ICD-10-CM | POA: Diagnosis not present

## 2023-09-15 DIAGNOSIS — S06320A Contusion and laceration of left cerebrum without loss of consciousness, initial encounter: Secondary | ICD-10-CM | POA: Diagnosis not present

## 2023-09-15 DIAGNOSIS — R000 Tachycardia, unspecified: Secondary | ICD-10-CM | POA: Diagnosis not present

## 2023-09-15 DIAGNOSIS — S3991XA Unspecified injury of abdomen, initial encounter: Secondary | ICD-10-CM | POA: Diagnosis not present

## 2023-09-16 DIAGNOSIS — S0181XA Laceration without foreign body of other part of head, initial encounter: Secondary | ICD-10-CM | POA: Diagnosis not present

## 2023-09-16 NOTE — Patient Instructions (Signed)
Preventing Sexually Transmitted Infections, Adult Sexually transmitted infections (STIs) are spread from person to person (are contagious). They are spread, or transmitted, during sex. The sex may be vaginal, anal, or oral. STIs can be passed during sexual contact with skin, genitals, mouth, or rectum. They may spread through body fluids, such as saliva, semen, blood, vaginal mucus, and urine. STIs are very common. They can happen in people of all ages. Some common STIs are: Herpes. Hepatitis B. Chlamydia. Gonorrhea. Syphilis. Trichomoniasis. Human papillomavirus (HPV). Human immunodeficiency virus (HIV). This can cause acquired immunodeficiency syndrome (AIDS). How can STIs affect me? You may not have symptoms with an STI. Even if you do not have symptoms, you can still spread the infection to others. You also still need treatment. STIs can be treated. Some STIs can be cured. Other STIs cannot be cured and will affect you for the rest of your life. Certain STIs may: Require you to take medicine for the rest of your life. Affect your ability to have children. Increase your risk for getting other STIs. Increase your risk of getting certain conditions. These may include: Cervical cancer. Pelvic inflammatory disease (PID). Organ damage or damage to other parts of your body. This can happen if the infection spreads. Cause problems during pregnancy. STIs may be spread to the baby during pregnancy or birth. Females tend to have more severe problems from STIs than males. What can increase my risk? You may be more at risk for an STI if: You do not use protection during sex. You have more than one sex partner. You have a sex partner who has other sex partners. You have sex with a person who has an STI. You have an STI, or you have had an STI before. You inject drugs or have a sex partner who injects drugs. What actions can I take to prevent STIs? The only way to fully prevent STIs is not to  have sex of any kind. This is called practicing abstinence. If you are sexually active, you can protect yourself and others by taking these actions to lower your risk of getting an STI: Lifestyle Have only one sex partner or limit the number of sex partners you have. Avoid having sex after you have alcohol or drugs. Alcohol and drugs can affect your ability to make good choices. This can lead to risky sexual behaviors. Go to prevention counseling. This can teach you how to avoid getting an STI. Barrier protection  Use methods to stop body fluids from being exchanged between partners during sex (barrier protection). These methods can be used during oral, vaginal, or anal sex. They include: External condom, for males. Internal condom, for females. Dental dam. Use a new barrier method for every sex act from start to finish. Know that a barrier method may not protect you from all STIs. Some STIs, such as herpes, are spread through skin-to-skin contact. Avoid all sexual contact if you or a partner has herpes and there is an active flare with open sores. Birth control pills, injections, implants, and intrauterine devices (IUDs) do not protect against STIs. To prevent both STIs and pregnancy, always use a condom with a second form of birth control. General information Ask your health care provider about taking pre-exposure prophylaxis (PrEP) to prevent HIV. Stay up to date on your vaccines. Some vaccines can lower your risk of getting certain STIs. These include: Hepatitis B vaccine. HPV vaccine. This is recommended for people up to age 26. Get tested for STIs. Have your   partners get tested, too. If you test positive for an STI, follow recommendations from your health care provider about treatment. Make sure your sex partners are tested and treated as well. Where to find more information Learn more about STIs from: Centers for Disease Control and Prevention (CDC): More information about certain  STIs: cdc.gov Places to get sexual health counseling and treatment for free or at a low cost: gettested.cdc.gov U.S. Department of Health and Human Services (HHS): womenshealth.gov This information is not intended to replace advice given to you by your health care provider. Make sure you discuss any questions you have with your health care provider. Document Revised: 11/10/2022 Document Reviewed: 04/15/2022 Elsevier Patient Education  2024 Elsevier Inc.  

## 2023-09-19 ENCOUNTER — Ambulatory Visit: Payer: Medicaid Other | Admitting: Nurse Practitioner

## 2023-09-19 ENCOUNTER — Telehealth: Payer: Self-pay | Admitting: Nurse Practitioner

## 2023-09-19 ENCOUNTER — Encounter: Payer: Self-pay | Admitting: Nurse Practitioner

## 2023-09-19 VITALS — BP 117/72 | HR 79 | Temp 98.2°F | Wt 143.8 lb

## 2023-09-19 DIAGNOSIS — Z113 Encounter for screening for infections with a predominantly sexual mode of transmission: Secondary | ICD-10-CM

## 2023-09-19 DIAGNOSIS — R768 Other specified abnormal immunological findings in serum: Secondary | ICD-10-CM | POA: Diagnosis not present

## 2023-09-19 MED ORDER — CYCLOBENZAPRINE HCL 5 MG PO TABS: 5.0000 mg | ORAL_TABLET | Freq: Three times a day (TID) | ORAL | 1 refills | Status: DC | PRN

## 2023-09-19 NOTE — Assessment & Plan Note (Signed)
Ongoing.  Continue Valtrex 1000 MG to take daily.  Educated her on genital herpes and treatment regimen.  She wishes to repeat labs to check.
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
## 2023-09-19 NOTE — Assessment & Plan Note (Signed)
Overall beginning to heal with areas of bruising and lacerations.  Recommend Tylenol and Ibuprofen as needed for pain.  Ensure plenty of rest and she is to schedule follow-up with neurology at [facility] to get cleared to drive.

## 2023-09-19 NOTE — Telephone Encounter (Signed)
Called and LVM notifying patient that RX has been sent in for her.

## 2023-09-19 NOTE — Addendum Note (Signed)
Addended by: Aura Dials T on: 09/19/2023 04:20 PM   Modules accepted: Orders
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
## 2023-09-20 LAB — HSV 1 AND 2 AB, IGG
HSV 1 Glycoprotein G Ab, IgG: NONREACTIVE
HSV 2 IgG, Type Spec: REACTIVE — AB

## 2023-09-20 NOTE — Progress Notes (Signed)
Contacted via MyChart   HSV2 testing is still reactive, meaning presence of virus.

## 2023-09-21 DIAGNOSIS — F410 Panic disorder [episodic paroxysmal anxiety] without agoraphobia: Secondary | ICD-10-CM | POA: Diagnosis not present

## 2023-09-21 DIAGNOSIS — F3181 Bipolar II disorder: Secondary | ICD-10-CM | POA: Diagnosis not present

## 2023-09-21 DIAGNOSIS — F4312 Post-traumatic stress disorder, chronic: Secondary | ICD-10-CM | POA: Diagnosis not present

## 2023-09-22 ENCOUNTER — Telehealth: Payer: Self-pay | Admitting: Nurse Practitioner

## 2023-09-22 DIAGNOSIS — F3181 Bipolar II disorder: Secondary | ICD-10-CM
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
## 2023-09-22 NOTE — Telephone Encounter (Signed)
Labs printed and faxed as requested  

## 2023-09-25 ENCOUNTER — Ambulatory Visit: Payer: Medicaid Other

## 2023-09-25 NOTE — Addendum Note (Signed)
Addended by: Aura Dials T on: 09/25/2023 06:17 PM   Modules accepted: Orders

## 2023-09-27 DIAGNOSIS — F4312 Post-traumatic stress disorder, chronic: Secondary | ICD-10-CM | POA: Diagnosis not present

## 2023-09-27 DIAGNOSIS — F3181 Bipolar II disorder: Secondary | ICD-10-CM | POA: Diagnosis not present

## 2023-09-27 DIAGNOSIS — F410 Panic disorder [episodic paroxysmal anxiety] without agoraphobia: Secondary | ICD-10-CM | POA: Diagnosis not present

## 2023-09-28 ENCOUNTER — Ambulatory Visit: Payer: Medicaid Other | Admitting: Nurse Practitioner

## 2023-09-28 ENCOUNTER — Other Ambulatory Visit: Payer: Medicaid Other

## 2023-09-28 DIAGNOSIS — F3181 Bipolar II disorder: Secondary | ICD-10-CM | POA: Diagnosis not present

## 2023-09-29 LAB — VALPROIC ACID LEVEL: Valproic Acid Lvl: 79 ug/mL (ref 50–100)

## 2023-09-29 NOTE — Progress Notes (Signed)
Contacted via MyChart   Valproic level has trended up to 79, normal range.  Please let psychiatrist know:)

## 2023-10-03 DIAGNOSIS — F4312 Post-traumatic stress disorder, chronic: Secondary | ICD-10-CM | POA: Diagnosis not present

## 2023-10-03 DIAGNOSIS — F3181 Bipolar II disorder: Secondary | ICD-10-CM | POA: Diagnosis not present

## 2023-10-03 DIAGNOSIS — F410 Panic disorder [episodic paroxysmal anxiety] without agoraphobia: Secondary | ICD-10-CM | POA: Diagnosis not present

## 2023-10-05 ENCOUNTER — Telehealth: Payer: Self-pay | Admitting: Nurse Practitioner

## 2023-10-05 DIAGNOSIS — F410 Panic disorder [episodic paroxysmal anxiety] without agoraphobia: Secondary | ICD-10-CM | POA: Diagnosis not present

## 2023-10-05 DIAGNOSIS — F4312 Post-traumatic stress disorder, chronic: Secondary | ICD-10-CM | POA: Diagnosis not present

## 2023-10-05 DIAGNOSIS — F3181 Bipolar II disorder: Secondary | ICD-10-CM | POA: Diagnosis not present

## 2023-10-05 NOTE — Telephone Encounter (Signed)
Called and notified patient that referral has been entered. Patient states that she was told that [facility] and [facility] do not have any appointments available at this time. Is there somewhere else we can send the patient?
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
## 2023-10-11 DIAGNOSIS — F410 Panic disorder [episodic paroxysmal anxiety] without agoraphobia: Secondary | ICD-10-CM | POA: Diagnosis not present

## 2023-10-11 DIAGNOSIS — F3181 Bipolar II disorder: Secondary | ICD-10-CM | POA: Diagnosis not present

## 2023-10-11 DIAGNOSIS — F4312 Post-traumatic stress disorder, chronic: Secondary | ICD-10-CM | POA: Diagnosis not present

## 2023-10-13 ENCOUNTER — Telehealth: Payer: Self-pay

## 2023-10-13 NOTE — Telephone Encounter (Signed)
error 

## 2023-10-18 ENCOUNTER — Telehealth: Payer: Self-pay | Admitting: Nurse Practitioner

## 2023-10-18 NOTE — Telephone Encounter (Signed)
FYI, patient has an appointment next week.
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
## 2023-10-18 NOTE — Telephone Encounter (Signed)
Noted.  She follows with psychiatry regularly and most often her ER visits are related to these issues -- would benefit from alerting psychiatry to this as well.

## 2023-10-23 ENCOUNTER — Encounter: Payer: Medicaid Other | Admitting: Nurse Practitioner

## 2023-11-01 DIAGNOSIS — M25552 Pain in left hip: Secondary | ICD-10-CM | POA: Diagnosis not present

## 2023-11-01 DIAGNOSIS — M25551 Pain in right hip: Secondary | ICD-10-CM | POA: Diagnosis not present

## 2023-11-02 DIAGNOSIS — J069 Acute upper respiratory infection, unspecified: Secondary | ICD-10-CM | POA: Diagnosis not present

## 2023-11-02 DIAGNOSIS — R070 Pain in throat: Secondary | ICD-10-CM | POA: Diagnosis not present

## 2023-11-07 DIAGNOSIS — M25552 Pain in left hip: Secondary | ICD-10-CM | POA: Diagnosis not present

## 2023-11-07 DIAGNOSIS — M25551 Pain in right hip: Secondary | ICD-10-CM | POA: Diagnosis not present

## 2023-11-14 DIAGNOSIS — M25552 Pain in left hip: Secondary | ICD-10-CM | POA: Diagnosis not present

## 2023-11-14 DIAGNOSIS — M25551 Pain in right hip: Secondary | ICD-10-CM | POA: Diagnosis not present

## 2023-11-16 DIAGNOSIS — M25551 Pain in right hip: Secondary | ICD-10-CM | POA: Diagnosis not present

## 2023-11-16 DIAGNOSIS — M25552 Pain in left hip: Secondary | ICD-10-CM | POA: Diagnosis not present

## 2023-11-28 ENCOUNTER — Ambulatory Visit: Payer: Medicaid Other

## 2023-11-29 ENCOUNTER — Encounter: Payer: Medicaid Other | Admitting: Nurse Practitioner

## 2023-11-29 DIAGNOSIS — A6004 Herpesviral vulvovaginitis: Secondary | ICD-10-CM

## 2023-11-29 DIAGNOSIS — Z0000 Encounter for general adult medical examination without abnormal findings: Secondary | ICD-10-CM

## 2023-11-29 DIAGNOSIS — F410 Panic disorder [episodic paroxysmal anxiety] without agoraphobia: Secondary | ICD-10-CM

## 2023-11-29 DIAGNOSIS — Z789 Other specified health status: Secondary | ICD-10-CM

## 2023-11-29 DIAGNOSIS — F3181 Bipolar II disorder: Secondary | ICD-10-CM

## 2023-11-29 DIAGNOSIS — Z13220 Encounter for screening for lipoid disorders: Secondary | ICD-10-CM

## 2023-11-29 DIAGNOSIS — R569 Unspecified convulsions: Secondary | ICD-10-CM

## 2023-12-06 DIAGNOSIS — F3181 Bipolar II disorder: Secondary | ICD-10-CM | POA: Diagnosis not present

## 2023-12-06 DIAGNOSIS — F4312 Post-traumatic stress disorder, chronic: Secondary | ICD-10-CM | POA: Diagnosis not present

## 2023-12-06 DIAGNOSIS — F410 Panic disorder [episodic paroxysmal anxiety] without agoraphobia: Secondary | ICD-10-CM | POA: Diagnosis not present

## 2023-12-31 NOTE — Patient Instructions (Incomplete)

## 2024-01-02 DIAGNOSIS — J069 Acute upper respiratory infection, unspecified: Secondary | ICD-10-CM | POA: Diagnosis not present

## 2024-01-03 ENCOUNTER — Telehealth: Payer: Medicaid Other | Admitting: Nurse Practitioner

## 2024-01-03 DIAGNOSIS — F410 Panic disorder [episodic paroxysmal anxiety] without agoraphobia: Secondary | ICD-10-CM

## 2024-01-03 DIAGNOSIS — Z13220 Encounter for screening for lipoid disorders: Secondary | ICD-10-CM

## 2024-01-03 DIAGNOSIS — F3181 Bipolar II disorder: Secondary | ICD-10-CM

## 2024-01-03 DIAGNOSIS — Z789 Other specified health status: Secondary | ICD-10-CM

## 2024-01-03 DIAGNOSIS — A6004 Herpesviral vulvovaginitis: Secondary | ICD-10-CM

## 2024-01-03 DIAGNOSIS — R569 Unspecified convulsions: Secondary | ICD-10-CM

## 2024-01-10 DIAGNOSIS — R45851 Suicidal ideations: Secondary | ICD-10-CM | POA: Diagnosis not present

## 2024-01-10 DIAGNOSIS — F319 Bipolar disorder, unspecified: Secondary | ICD-10-CM | POA: Diagnosis not present

## 2024-01-10 DIAGNOSIS — F603 Borderline personality disorder: Secondary | ICD-10-CM | POA: Diagnosis not present

## 2024-01-10 DIAGNOSIS — Z1152 Encounter for screening for COVID-19: Secondary | ICD-10-CM | POA: Diagnosis not present

## 2024-01-10 DIAGNOSIS — F4312 Post-traumatic stress disorder, chronic: Secondary | ICD-10-CM | POA: Diagnosis not present

## 2024-01-10 DIAGNOSIS — Z720 Tobacco use: Secondary | ICD-10-CM | POA: Diagnosis not present

## 2024-01-10 DIAGNOSIS — F3181 Bipolar II disorder: Secondary | ICD-10-CM | POA: Diagnosis not present

## 2024-01-10 DIAGNOSIS — F410 Panic disorder [episodic paroxysmal anxiety] without agoraphobia: Secondary | ICD-10-CM | POA: Diagnosis not present

## 2024-01-11 DIAGNOSIS — G4089 Other seizures: Secondary | ICD-10-CM | POA: Diagnosis not present

## 2024-01-12 DIAGNOSIS — F319 Bipolar disorder, unspecified: Secondary | ICD-10-CM | POA: Diagnosis not present

## 2024-01-15 DIAGNOSIS — F319 Bipolar disorder, unspecified: Secondary | ICD-10-CM | POA: Diagnosis not present

## 2024-01-15 DIAGNOSIS — N912 Amenorrhea, unspecified: Secondary | ICD-10-CM | POA: Diagnosis not present

## 2024-01-15 DIAGNOSIS — G4089 Other seizures: Secondary | ICD-10-CM | POA: Diagnosis not present

## 2024-01-15 DIAGNOSIS — J309 Allergic rhinitis, unspecified: Secondary | ICD-10-CM | POA: Diagnosis not present

## 2024-01-16 DIAGNOSIS — F410 Panic disorder [episodic paroxysmal anxiety] without agoraphobia: Secondary | ICD-10-CM | POA: Diagnosis not present

## 2024-01-16 DIAGNOSIS — F4312 Post-traumatic stress disorder, chronic: Secondary | ICD-10-CM | POA: Diagnosis not present

## 2024-01-16 DIAGNOSIS — F3181 Bipolar II disorder: Secondary | ICD-10-CM | POA: Diagnosis not present

## 2024-01-22 ENCOUNTER — Encounter: Payer: Self-pay | Admitting: Nurse Practitioner

## 2024-01-22 DIAGNOSIS — F410 Panic disorder [episodic paroxysmal anxiety] without agoraphobia: Secondary | ICD-10-CM | POA: Diagnosis not present

## 2024-01-22 DIAGNOSIS — F4312 Post-traumatic stress disorder, chronic: Secondary | ICD-10-CM | POA: Diagnosis not present

## 2024-01-22 DIAGNOSIS — F3181 Bipolar II disorder: Secondary | ICD-10-CM | POA: Diagnosis not present

## 2024-01-23 DIAGNOSIS — Z3202 Encounter for pregnancy test, result negative: Secondary | ICD-10-CM | POA: Diagnosis not present

## 2024-01-23 DIAGNOSIS — N3001 Acute cystitis with hematuria: Secondary | ICD-10-CM | POA: Diagnosis not present

## 2024-01-25 DIAGNOSIS — F410 Panic disorder [episodic paroxysmal anxiety] without agoraphobia: Secondary | ICD-10-CM | POA: Diagnosis not present

## 2024-01-25 DIAGNOSIS — F4312 Post-traumatic stress disorder, chronic: Secondary | ICD-10-CM | POA: Diagnosis not present

## 2024-01-25 DIAGNOSIS — F3181 Bipolar II disorder: Secondary | ICD-10-CM | POA: Diagnosis not present

## 2024-01-29 DIAGNOSIS — R569 Unspecified convulsions: Secondary | ICD-10-CM | POA: Diagnosis not present

## 2024-01-29 DIAGNOSIS — F410 Panic disorder [episodic paroxysmal anxiety] without agoraphobia: Secondary | ICD-10-CM | POA: Diagnosis not present

## 2024-01-31 DIAGNOSIS — F3181 Bipolar II disorder: Secondary | ICD-10-CM | POA: Diagnosis not present

## 2024-01-31 DIAGNOSIS — F4312 Post-traumatic stress disorder, chronic: Secondary | ICD-10-CM | POA: Diagnosis not present

## 2024-01-31 DIAGNOSIS — F410 Panic disorder [episodic paroxysmal anxiety] without agoraphobia: Secondary | ICD-10-CM | POA: Diagnosis not present

## 2024-02-01 ENCOUNTER — Telehealth: Payer: Self-pay | Admitting: Nurse Practitioner

## 2024-02-01 DIAGNOSIS — F410 Panic disorder [episodic paroxysmal anxiety] without agoraphobia: Secondary | ICD-10-CM | POA: Diagnosis not present

## 2024-02-01 DIAGNOSIS — F4312 Post-traumatic stress disorder, chronic: Secondary | ICD-10-CM | POA: Diagnosis not present

## 2024-02-01 DIAGNOSIS — F3181 Bipolar II disorder: Secondary | ICD-10-CM | POA: Diagnosis not present

## 2024-02-01 NOTE — Telephone Encounter (Signed)
 Patient called office requesting Labs to check Depakote level for psychiatrist. Patient call back [phone]. Please Advise.

## 2024-02-01 NOTE — Telephone Encounter (Signed)
 Routing to provider. Can we do this for the patient?

## 2024-02-02 ENCOUNTER — Ambulatory Visit: Admitting: Nurse Practitioner

## 2024-02-02 NOTE — Telephone Encounter (Signed)
 Appointment has been made

## 2024-02-05 ENCOUNTER — Other Ambulatory Visit

## 2024-02-05 DIAGNOSIS — F3181 Bipolar II disorder: Secondary | ICD-10-CM | POA: Diagnosis not present

## 2024-02-06 ENCOUNTER — Encounter: Payer: Self-pay | Admitting: Nurse Practitioner

## 2024-02-06 LAB — VALPROIC ACID LEVEL: Valproic Acid Lvl: 108 ug/mL — ABNORMAL HIGH (ref 50–100)

## 2024-02-06 NOTE — Progress Notes (Signed)
 Contacted via MyChart   Depakote level a little elevated this check, please let psychiatry know:)

## 2024-02-07 DIAGNOSIS — F3181 Bipolar II disorder: Secondary | ICD-10-CM | POA: Diagnosis not present

## 2024-02-07 DIAGNOSIS — F410 Panic disorder [episodic paroxysmal anxiety] without agoraphobia: Secondary | ICD-10-CM | POA: Diagnosis not present

## 2024-02-07 DIAGNOSIS — F4312 Post-traumatic stress disorder, chronic: Secondary | ICD-10-CM | POA: Diagnosis not present

## 2024-02-22 DIAGNOSIS — F4312 Post-traumatic stress disorder, chronic: Secondary | ICD-10-CM | POA: Diagnosis not present

## 2024-02-22 DIAGNOSIS — F410 Panic disorder [episodic paroxysmal anxiety] without agoraphobia: Secondary | ICD-10-CM | POA: Diagnosis not present

## 2024-02-22 DIAGNOSIS — F3181 Bipolar II disorder: Secondary | ICD-10-CM | POA: Diagnosis not present

## 2024-02-29 DIAGNOSIS — F4312 Post-traumatic stress disorder, chronic: Secondary | ICD-10-CM | POA: Diagnosis not present

## 2024-02-29 DIAGNOSIS — F3181 Bipolar II disorder: Secondary | ICD-10-CM | POA: Diagnosis not present

## 2024-02-29 DIAGNOSIS — F410 Panic disorder [episodic paroxysmal anxiety] without agoraphobia: Secondary | ICD-10-CM | POA: Diagnosis not present

## 2024-03-07 DIAGNOSIS — F4312 Post-traumatic stress disorder, chronic: Secondary | ICD-10-CM | POA: Diagnosis not present

## 2024-03-07 DIAGNOSIS — F410 Panic disorder [episodic paroxysmal anxiety] without agoraphobia: Secondary | ICD-10-CM | POA: Diagnosis not present

## 2024-03-07 DIAGNOSIS — F3181 Bipolar II disorder: Secondary | ICD-10-CM | POA: Diagnosis not present

## 2024-03-08 DIAGNOSIS — F3181 Bipolar II disorder: Secondary | ICD-10-CM | POA: Diagnosis not present

## 2024-03-08 DIAGNOSIS — F4312 Post-traumatic stress disorder, chronic: Secondary | ICD-10-CM | POA: Diagnosis not present

## 2024-03-08 DIAGNOSIS — F410 Panic disorder [episodic paroxysmal anxiety] without agoraphobia: Secondary | ICD-10-CM | POA: Diagnosis not present

## 2024-03-14 DIAGNOSIS — F4312 Post-traumatic stress disorder, chronic: Secondary | ICD-10-CM | POA: Diagnosis not present

## 2024-03-14 DIAGNOSIS — F410 Panic disorder [episodic paroxysmal anxiety] without agoraphobia: Secondary | ICD-10-CM | POA: Diagnosis not present

## 2024-03-14 DIAGNOSIS — F3181 Bipolar II disorder: Secondary | ICD-10-CM | POA: Diagnosis not present

## 2024-03-21 DIAGNOSIS — F3181 Bipolar II disorder: Secondary | ICD-10-CM | POA: Diagnosis not present

## 2024-03-21 DIAGNOSIS — F410 Panic disorder [episodic paroxysmal anxiety] without agoraphobia: Secondary | ICD-10-CM | POA: Diagnosis not present

## 2024-03-21 DIAGNOSIS — F4312 Post-traumatic stress disorder, chronic: Secondary | ICD-10-CM | POA: Diagnosis not present

## 2024-03-25 DIAGNOSIS — F3181 Bipolar II disorder: Secondary | ICD-10-CM | POA: Diagnosis not present

## 2024-03-25 DIAGNOSIS — F410 Panic disorder [episodic paroxysmal anxiety] without agoraphobia: Secondary | ICD-10-CM | POA: Diagnosis not present

## 2024-03-25 DIAGNOSIS — F4312 Post-traumatic stress disorder, chronic: Secondary | ICD-10-CM | POA: Diagnosis not present

## 2024-04-03 DIAGNOSIS — F4312 Post-traumatic stress disorder, chronic: Secondary | ICD-10-CM | POA: Diagnosis not present

## 2024-04-03 DIAGNOSIS — F3181 Bipolar II disorder: Secondary | ICD-10-CM | POA: Diagnosis not present

## 2024-04-03 DIAGNOSIS — F410 Panic disorder [episodic paroxysmal anxiety] without agoraphobia: Secondary | ICD-10-CM | POA: Diagnosis not present

## 2024-04-04 DIAGNOSIS — F3181 Bipolar II disorder: Secondary | ICD-10-CM | POA: Diagnosis not present

## 2024-04-04 DIAGNOSIS — F410 Panic disorder [episodic paroxysmal anxiety] without agoraphobia: Secondary | ICD-10-CM | POA: Diagnosis not present

## 2024-04-04 DIAGNOSIS — F4312 Post-traumatic stress disorder, chronic: Secondary | ICD-10-CM | POA: Diagnosis not present

## 2024-04-18 DIAGNOSIS — F3181 Bipolar II disorder: Secondary | ICD-10-CM | POA: Diagnosis not present

## 2024-04-18 DIAGNOSIS — F410 Panic disorder [episodic paroxysmal anxiety] without agoraphobia: Secondary | ICD-10-CM | POA: Diagnosis not present

## 2024-04-18 DIAGNOSIS — F4312 Post-traumatic stress disorder, chronic: Secondary | ICD-10-CM | POA: Diagnosis not present

## 2024-04-30 DIAGNOSIS — F3181 Bipolar II disorder: Secondary | ICD-10-CM | POA: Diagnosis not present

## 2024-04-30 DIAGNOSIS — F410 Panic disorder [episodic paroxysmal anxiety] without agoraphobia: Secondary | ICD-10-CM | POA: Diagnosis not present

## 2024-04-30 DIAGNOSIS — F4312 Post-traumatic stress disorder, chronic: Secondary | ICD-10-CM | POA: Diagnosis not present

## 2024-05-02 DIAGNOSIS — G4089 Other seizures: Secondary | ICD-10-CM | POA: Diagnosis not present

## 2024-05-02 DIAGNOSIS — F3181 Bipolar II disorder: Secondary | ICD-10-CM | POA: Diagnosis not present

## 2024-05-02 DIAGNOSIS — F4312 Post-traumatic stress disorder, chronic: Secondary | ICD-10-CM | POA: Diagnosis not present

## 2024-05-02 DIAGNOSIS — F410 Panic disorder [episodic paroxysmal anxiety] without agoraphobia: Secondary | ICD-10-CM | POA: Diagnosis not present

## 2024-05-06 DIAGNOSIS — M79672 Pain in left foot: Secondary | ICD-10-CM | POA: Diagnosis not present

## 2024-05-06 DIAGNOSIS — S96912A Strain of unspecified muscle and tendon at ankle and foot level, left foot, initial encounter: Secondary | ICD-10-CM | POA: Diagnosis not present

## 2024-05-16 DIAGNOSIS — F3181 Bipolar II disorder: Secondary | ICD-10-CM | POA: Diagnosis not present

## 2024-05-16 DIAGNOSIS — F4312 Post-traumatic stress disorder, chronic: Secondary | ICD-10-CM | POA: Diagnosis not present

## 2024-05-16 DIAGNOSIS — F410 Panic disorder [episodic paroxysmal anxiety] without agoraphobia: Secondary | ICD-10-CM | POA: Diagnosis not present

## 2024-05-16 DIAGNOSIS — G4089 Other seizures: Secondary | ICD-10-CM | POA: Diagnosis not present

## 2024-05-21 DIAGNOSIS — F3181 Bipolar II disorder: Secondary | ICD-10-CM | POA: Diagnosis not present

## 2024-05-21 DIAGNOSIS — F4312 Post-traumatic stress disorder, chronic: Secondary | ICD-10-CM | POA: Diagnosis not present

## 2024-05-21 DIAGNOSIS — F410 Panic disorder [episodic paroxysmal anxiety] without agoraphobia: Secondary | ICD-10-CM | POA: Diagnosis not present

## 2024-06-04 ENCOUNTER — Telehealth: Payer: Self-pay | Admitting: Nurse Practitioner

## 2024-06-04 NOTE — Telephone Encounter (Signed)
Scheduled for 8/1

## 2024-06-04 NOTE — Telephone Encounter (Signed)
 Can this be scheduled?    Copied from CRM [id number]. Topic: Appointments - Scheduling Inquiry for Clinic >> Jun 04, 2024 12:46 PM Corin V wrote: Reason for CRM: Patient is calling to schedule her Depo-Provera  shot. Please call back [phone]

## 2024-06-04 NOTE — Telephone Encounter (Signed)
 Patient has not had a depo injection since October 28th according to chart. Patient will need an office visit since patient is more than 6 months overdue for an injection. Please call to schedule.

## 2024-06-05 DIAGNOSIS — F319 Bipolar disorder, unspecified: Secondary | ICD-10-CM | POA: Diagnosis not present

## 2024-06-05 DIAGNOSIS — Z1331 Encounter for screening for depression: Secondary | ICD-10-CM | POA: Diagnosis not present

## 2024-06-05 DIAGNOSIS — F445 Conversion disorder with seizures or convulsions: Secondary | ICD-10-CM | POA: Diagnosis not present

## 2024-06-10 ENCOUNTER — Other Ambulatory Visit: Payer: Self-pay | Admitting: Nurse Practitioner

## 2024-06-10 NOTE — Patient Instructions (Incomplete)
 Managing Anxiety, Adult  After being diagnosed with anxiety, you may be relieved to know why you have felt or behaved a certain way. You may also feel overwhelmed about the treatment ahead and what it will mean for your life. With care and support, you can manage your anxiety.  How to manage lifestyle changes  Understanding the difference between stress and anxiety  Although stress can play a role in anxiety, it is not the same as anxiety. Stress is your body's reaction to life changes and events, both good and bad. Stress is often caused by something external, such as a deadline, test, or competition. It normally goes away after the event has ended and will last just a few hours. But, stress can be ongoing and can lead to more than just stress.  Anxiety is caused by something internal, such as imagining a terrible outcome or worrying that something will go wrong that will greatly upset you. Anxiety often does not go away even after the event is over, and it can become a long-term (chronic) worry.  Lowering stress and anxiety    Talk with your health care provider or a counselor to learn more about lowering anxiety and stress. They may suggest tension-reduction techniques, such as:  Music. Spend time creating or listening to music that you enjoy and that inspires you.  Mindfulness-based meditation. Practice being aware of your normal breaths while not trying to control your breathing. It can be done while sitting or walking.  Centering prayer. Focus on a word, phrase, or sacred image that means something to you and brings you peace.  Deep breathing. Expand your stomach and inhale slowly through your nose. Hold your breath for 3-5 seconds. Then breathe out slowly, letting your stomach muscles relax.  Self-talk. Learn to notice and spot thought patterns that lead to anxiety reactions. Change those patterns to thoughts that feel peaceful.  Muscle relaxation. Take time to tense muscles and then relax them.  Choose a  tension-reduction technique that fits your lifestyle and personality. These techniques take time and practice. Set aside 5-15 minutes a day to do them. Specialized therapists can offer counseling and training in these techniques. The training to help with anxiety may be covered by some insurance plans.  Other things you can do to manage stress and anxiety include:  Keeping a stress diary. This can help you learn what triggers your reaction and then learn ways to manage your response.  Thinking about how you react to certain situations. You may not be able to control everything, but you can control your response.  Making time for activities that help you relax and not feeling guilty about spending your time in this way.  Doing visual imagery. This involves imagining or creating mental pictures to help you relax.  Practicing yoga. Through yoga poses, you can lower tension and relax.     Medicines  Medicines for anxiety include:  Antidepressant medicines. These are usually prescribed for long-term daily control.  Anti-anxiety medicines. These may be added in severe cases, especially when panic attacks occur.  When used together, medicines, psychotherapy, and tension-reduction techniques may be the most effective treatment.  Relationships  Relationships can play a big part in helping you recover. Spend more time connecting with trusted friends and family members. Think about going to couples counseling if you have a partner, taking family education classes, or going to family therapy. Therapy can help you and others better understand your anxiety.  How to recognize changes in  your anxiety  Everyone responds differently to treatment for anxiety. Recovery from anxiety happens when symptoms lessen and stop interfering with your daily life at home or work. This may mean that you will start to:  Have better concentration and focus. Worry will interfere less in your daily thinking.  Sleep better.  Be less irritable.  Have  more energy.  Have improved memory.  Try to recognize when your condition is getting worse. Contact your provider if your symptoms interfere with home or work and you feel like your condition is not improving.  Follow these instructions at home:  Activity  Exercise. Adults should:  Exercise for at least 150 minutes each week. The exercise should increase your heart rate and make you sweat (moderate-intensity exercise).  Do strengthening exercises at least twice a week.  Get the right amount and quality of sleep. Most adults need 7-9 hours of sleep each night.  Lifestyle    Eat a healthy diet that includes plenty of vegetables, fruits, whole grains, low-fat dairy products, and lean protein.  Do not eat a lot of foods that are high in fats, added sugars, or salt (sodium).  Make choices that simplify your life.  Do not use any products that contain nicotine or tobacco. These products include cigarettes, chewing tobacco, and vaping devices, such as e-cigarettes. If you need help quitting, ask your provider.  Avoid caffeine, alcohol, and certain over-the-counter cold medicines. These may make you feel worse. Ask your pharmacist which medicines to avoid.  General instructions  Take over-the-counter and prescription medicines only as told by your provider.  Keep all follow-up visits. This is to make sure you are managing your anxiety well or if you need more support.  Where to find support  You can get help and support from:  Self-help groups.  Online and [employer].  A trusted spiritual leader.  Couples counseling.  Family education classes.  Family therapy.  Where to find more information  You may find that joining a support group helps you deal with your anxiety. The following sources can help you find counselors or support groups near you:  Mental Health America: mentalhealthamerica.net  Anxiety and Depression Association of [place] (ADAA): adaa.org  [employer] Mental [employer]ness (NAMI):  nami.org  Contact a health care provider if:  You have a hard time staying focused or finishing tasks.  You spend many hours a day feeling worried about everyday life.  You are very tired because you cannot stop worrying.  You start to have headaches or often feel tense.  You have chronic nausea or diarrhea.  Get help right away if:  Your heart feels like it is racing.  You have shortness of breath.  You have thoughts of hurting yourself or others.  Get help right away if you feel like you may hurt yourself or others, or have thoughts about taking your own life. Go to your nearest emergency room or:  Call 911.  Call the National Suicide Prevention Lifeline at 2043122231 or 988. This is open 24 hours a day.  Text the Crisis Text Line at (832)170-7952.  This information is not intended to replace advice given to you by your health care provider. Make sure you discuss any questions you have with your health care provider.  Document Revised: 08/09/2022 Document Reviewed: 02/21/2021  Elsevier Patient Education  2024 [employer].

## 2024-06-11 NOTE — Telephone Encounter (Signed)
 Requested Prescriptions  Pending Prescriptions Disp Refills   valACYclovir  (VALTREX ) 1000 MG tablet [Pharmacy Med Name: VALACYCLOVIR  HCL 1 GRAM TABLET] 90 tablet 0    Sig: TAKE 1 TABLET BY MOUTH EVERY DAY     Antimicrobials:  Antiviral Agents - Anti-Herpetic Failed - 06/11/2024  4:47 PM      Failed - Valid encounter within last 12 months    Recent Outpatient Visits   None

## 2024-06-12 DIAGNOSIS — F410 Panic disorder [episodic paroxysmal anxiety] without agoraphobia: Secondary | ICD-10-CM | POA: Diagnosis not present

## 2024-06-12 DIAGNOSIS — F3181 Bipolar II disorder: Secondary | ICD-10-CM | POA: Diagnosis not present

## 2024-06-12 DIAGNOSIS — F4312 Post-traumatic stress disorder, chronic: Secondary | ICD-10-CM | POA: Diagnosis not present

## 2024-06-14 ENCOUNTER — Ambulatory Visit: Admitting: Nurse Practitioner

## 2024-07-03 DIAGNOSIS — F3181 Bipolar II disorder: Secondary | ICD-10-CM | POA: Diagnosis not present

## 2024-07-03 DIAGNOSIS — F4312 Post-traumatic stress disorder, chronic: Secondary | ICD-10-CM | POA: Diagnosis not present

## 2024-07-03 DIAGNOSIS — F410 Panic disorder [episodic paroxysmal anxiety] without agoraphobia: Secondary | ICD-10-CM | POA: Diagnosis not present

## 2024-07-16 ENCOUNTER — Ambulatory Visit

## 2024-07-16 DIAGNOSIS — F4312 Post-traumatic stress disorder, chronic: Secondary | ICD-10-CM | POA: Diagnosis not present

## 2024-07-16 DIAGNOSIS — F3181 Bipolar II disorder: Secondary | ICD-10-CM | POA: Diagnosis not present

## 2024-07-16 DIAGNOSIS — F410 Panic disorder [episodic paroxysmal anxiety] without agoraphobia: Secondary | ICD-10-CM | POA: Diagnosis not present

## 2024-07-17 ENCOUNTER — Ambulatory Visit: Admitting: Nurse Practitioner

## 2024-07-17 ENCOUNTER — Encounter: Payer: Self-pay | Admitting: Nurse Practitioner

## 2024-07-17 VITALS — BP 121/77 | HR 69 | Temp 98.2°F | Ht 63.0 in | Wt 160.0 lb

## 2024-07-17 DIAGNOSIS — F410 Panic disorder [episodic paroxysmal anxiety] without agoraphobia: Secondary | ICD-10-CM | POA: Diagnosis not present

## 2024-07-17 DIAGNOSIS — Z789 Other specified health status: Secondary | ICD-10-CM | POA: Diagnosis not present

## 2024-07-17 DIAGNOSIS — F3181 Bipolar II disorder: Secondary | ICD-10-CM | POA: Diagnosis not present

## 2024-07-17 DIAGNOSIS — Z3042 Encounter for surveillance of injectable contraceptive: Secondary | ICD-10-CM

## 2024-07-17 DIAGNOSIS — F445 Conversion disorder with seizures or convulsions: Secondary | ICD-10-CM | POA: Diagnosis not present

## 2024-07-17 DIAGNOSIS — Z23 Encounter for immunization: Secondary | ICD-10-CM | POA: Diagnosis not present

## 2024-07-17 LAB — PREGNANCY, URINE: Preg Test, Ur: NEGATIVE

## 2024-07-17 MED ORDER — MEDROXYPROGESTERONE ACETATE 150 MG/ML IM SUSY
150.0000 mg | PREFILLED_SYRINGE | INTRAMUSCULAR | Status: AC
  Administered 2024-07-17: 150 mg via INTRAMUSCULAR

## 2024-07-17 NOTE — Assessment & Plan Note (Addendum)
 Urine pregnancy testing today was negative.  Check CMP and lipid panel at physical in October.  Depo restarted today.

## 2024-07-17 NOTE — Assessment & Plan Note (Signed)
 Chronic, ongoing.  Followed by psychiatry.  Denies SI/HI. Continue medication regimen as prescribed by them.  Discussed with her at length and educated.  Has safety plan in place if suicidal ideation were to present, is aware to immediately go to ER if intrusive thoughts. Labs at physical in October.

## 2024-07-17 NOTE — Assessment & Plan Note (Signed)
 Ongoing and stable at present with no recent seizures.  She is being followed by [facility] neurology.  Continue this collaboration and Depakote  as ordered. Recent notes reviewed.

## 2024-07-17 NOTE — Progress Notes (Signed)
 BP 121/77   Pulse 69   Temp 98.2 F (36.8 C) (Oral)   Ht 5' 3 (1.6 m)   Wt 160 lb (72.6 kg)   SpO2 99%   BMI 28.34 kg/m    Subjective:    Patient ID: Erica Long, female    DOB: 14-Jul-2001, 23 y.o.   MRN: [id number]  HPI: Erica Long is a 23 y.o. female  Chief Complaint  Patient presents with   Contraception   CONTRACEPTION CONCERNS Would like to restart Depo, last injection was in August 2024.  Has been using condoms since that time. Depo worked well for her. Had one period of spotting since stopping, but no cycle. Contraception: condoms Previous contraception: Depo  Sexual activity: yes Gravida/Para: 0/0 Menarche at age: 23 years old Average interval between menses: unknown Length of menses: unknown Flow: as above Dysmenorrhea: in the past  BIPOLAR DISORDER & SEIZURE DISORDER Follows with psychiatry. Continues on Depakote , Atarax , Vraylar, Ativan . Saw therapist yesterday and psychiatry last week. Started EMDR.  Neurology visit on 06/05/24, diagnosed with psychogenic nonepileptic seizures. Mood status: uncontrolled Satisfied with current treatment?: yes Symptom severity: moderate  Duration of current treatment : chronic Side effects: no Medication compliance: good compliance Psychotherapy/counseling: yes current Previous psychiatric medications: multiple medications Depressed mood: yes Anxious mood: yes Anhedonia: occasional Significant weight loss or gain: no Insomnia:  varies between too little and too much Fatigue: no Feelings of worthlessness or guilt: yes Impaired concentration/indecisiveness: yes Suicidal ideations: no Hopelessness: yes Crying spells: yes    07/17/2024    8:25 AM 09/19/2023    8:33 AM 09/11/2023    2:35 PM 08/09/2023   10:16 AM 07/12/2023    9:58 AM  Depression screen PHQ 2/9  Decreased Interest 3 3 1 2 2   Down, Depressed, Hopeless 3 3 1 1 1   PHQ - 2 Score 6 6 2 3 3   Altered sleeping 3 3 3 1 3   Tired, decreased energy 3 3 3 2  3   Change in appetite 3 3 3 3 3   Feeling bad or failure about yourself  3 3 2 2 3   Trouble concentrating 2 2 2 1 3   Moving slowly or fidgety/restless 2 2 3 2 3   Suicidal thoughts 1 3 0 0 0  PHQ-9 Score 23 25 18 14 21   Difficult doing work/chores  Extremely dIfficult  Somewhat difficult Very difficult       07/17/2024    8:25 AM 09/19/2023    8:33 AM 09/11/2023    2:35 PM 08/09/2023   10:16 AM  GAD 7 : Generalized Anxiety Score  Nervous, Anxious, on Edge 3 3 3 2   Control/stop worrying 3 3 3 3   Worry too much - different things 3 3 3 3   Trouble relaxing 3 3 2 1   Restless 2 2 1 1   Easily annoyed or irritable 3 2 1 2   Afraid - awful might happen 2 3 2 1   Total GAD 7 Score 19 19 15 13   Anxiety Difficulty Somewhat difficult Extremely difficult  Somewhat difficult   Relevant past medical, surgical, family and social history reviewed and updated as indicated. Interim medical history since our last visit reviewed. Allergies and medications reviewed and updated.  Review of Systems  Constitutional:  Negative for activity change, appetite change, diaphoresis, fatigue and fever.  Respiratory:  Negative for cough, chest tightness, shortness of breath and wheezing.   Cardiovascular:  Negative for chest pain, palpitations and leg swelling.  Gastrointestinal: Negative.  Neurological: Negative.   Psychiatric/Behavioral: Negative.      Per HPI unless specifically indicated above     Objective:    BP 121/77   Pulse 69   Temp 98.2 F (36.8 C) (Oral)   Ht 5' 3 (1.6 m)   Wt 160 lb (72.6 kg)   SpO2 99%   BMI 28.34 kg/m   Wt Readings from Last 3 Encounters:  07/17/24 160 lb (72.6 kg)  09/19/23 143 lb 12.8 oz (65.2 kg)  09/11/23 145 lb 6.4 oz (66 kg)    Physical Exam Vitals and nursing note reviewed.  Constitutional:      General: She is awake. She is not in acute distress.    Appearance: She is well-developed and well-groomed. She is not ill-appearing or toxic-appearing.  HENT:      Head: Normocephalic.     Right Ear: Hearing and external ear normal.     Left Ear: Hearing and external ear normal.  Eyes:     General: Lids are normal.        Right eye: No discharge.        Left eye: No discharge.     Conjunctiva/sclera: Conjunctivae normal.     Pupils: Pupils are equal, round, and reactive to light.  Neck:     Thyroid : No thyromegaly.     Vascular: No carotid bruit.  Cardiovascular:     Rate and Rhythm: Normal rate and regular rhythm.     Heart sounds: Normal heart sounds. No murmur heard.    No gallop.  Pulmonary:     Effort: Pulmonary effort is normal. No accessory muscle usage or respiratory distress.     Breath sounds: Normal breath sounds.  Abdominal:     General: Bowel sounds are normal. There is no distension.     Palpations: Abdomen is soft.     Tenderness: There is no abdominal tenderness.  Musculoskeletal:     Cervical back: Normal range of motion and neck supple.     Right lower leg: No edema.     Left lower leg: No edema.  Lymphadenopathy:     Cervical: No cervical adenopathy.  Skin:    General: Skin is warm and dry.  Neurological:     Mental Status: She is alert and oriented to person, place, and time.     Deep Tendon Reflexes: Reflexes are normal and symmetric.     Reflex Scores:      Brachioradialis reflexes are 2+ on the right side and 2+ on the left side.      Patellar reflexes are 2+ on the right side and 2+ on the left side. Psychiatric:        Attention and Perception: Attention normal.        Mood and Affect: Mood normal.        Speech: Speech normal.        Behavior: Behavior normal. Behavior is cooperative.        Thought Content: Thought content normal.    Results for orders placed or performed in visit on 02/05/24  Valproic Acid  level   Collection Time: 02/05/24  2:07 PM  Result Value Ref Range   Valproic Acid  Lvl 108 (H) 50 - 100 ug/mL      Assessment & Plan:   Problem List Items Addressed This Visit       Other    Uses Depo-Provera  as primary birth control method   Urine pregnancy testing today was negative.  Check CMP and lipid  panel at physical in October.  Depo restarted today.      Relevant Medications   medroxyPROGESTERone  Acetate SUSY 150 mg (Start on 07/17/2024  9:00 AM)   Other Relevant Orders   POCT urine pregnancy   Pregnancy, urine (STAT)   Psychogenic nonepileptic seizure   Ongoing and stable at present with no recent seizures.  She is being followed by [facility] neurology.  Continue this collaboration and Depakote  as ordered. Recent notes reviewed.      Panic disorder (episodic paroxysmal anxiety)   Refer to Bipolar plan of care.      Relevant Medications   LORazepam  (ATIVAN ) 0.5 MG tablet   Bipolar 2 disorder (HCC) - Primary   Chronic, ongoing.  Followed by psychiatry.  Denies SI/HI. Continue medication regimen as prescribed by them.  Discussed with her at length and educated.  Has safety plan in place if suicidal ideation were to present, is aware to immediately go to ER if intrusive thoughts. Labs at physical in October.      Other Visit Diagnoses       Flu vaccine need       Flu shot in office today, educated on this.   Relevant Orders   Flu vaccine trivalent PF, 6mos and older(Flulaval,Afluria,Fluarix,Fluzone)        Follow up plan: Return in about 6 weeks (around 08/27/2024) for Annual Physical.

## 2024-07-17 NOTE — Assessment & Plan Note (Signed)
Refer to Bipolar plan of care. 

## 2024-07-17 NOTE — Patient Instructions (Signed)
 Birth Control Shot: What to Expect A birth control shot prevents pregnancy. A birth control shot is put in (injected) into the skin or a muscle. The shot contains the hormone progestin. Hormones are chemicals that affect how the body works. Progestin prevents pregnancy because it: Stops the ovaries from releasing eggs. Makes cervical mucus thicker. This prevents sperm from getting into the cervix. The cervix is the lowest part of the uterus. Thins the lining of the uterus to prevent a fertilized egg from attaching to the uterus. Tell a health care provider about: Any allergies you have. All medicines you take. These include vitamins, herbs, eye drops, and creams. Any bleeding problems you have. Any medical problems you have. Whether you're pregnant or may be pregnant. What are the risks? Your health care provider will talk with you about risks. These may include: Mood changes or depression. Your bones becoming thinner or weaker. This is called loss of bone density. This can happen if you get the shots for a long period of time. This can cause your bones to break. Blood clots. These are rare. A higher risk of an egg being fertilized outside your uterus, called an ectopic pregnancy.This is rare. What happens before? Your provider may do a physical exam. You may have a test to make sure you aren't pregnant. What happens during a birth control shot?  The area where the shot will be given will be cleaned. A needle will be put into a muscle in your upper arm or butt, or into the skin of your thigh or belly. The needle will be put on a syringe with the medicine in it. The medicine will be pushed through the syringe into your body. A small bandage may be put over the place where the shot was given. What happens after? After the shot, it's common to have: Soreness around the place where the shot was given for a couple of days. Spotting or bleeding between periods. Weight gain. Tender  breasts. Headaches. Belly pain. Ask your provider if you need to use an added method of birth control, such as a condom, sponge, or spermicide. If the first shot is given 1-7 days after the start of your last period, you won't need to use an added method of birth control. If the first shot is given at any other time during your menstrual cycle, you'll need to use an added method of birth control for 7 days after you get the shot. Follow these instructions at home: General instructions Take your medicines only as told. Do not rub or massage the place where the shot was given. Track your periods. This will help you know if they become irregular. Always use a condom to protect against sexually transmitted infections (STIs). Make an appointment in time for your next shot and mark it on your calendar. You must get a shot every 3 months (12-13 weeks) to prevent pregnancy. Lifestyle Do not smoke, vape, or use nicotine or tobacco. Eat foods that are high in calcium and vitamin D, such as milk, cheese, and salmon. Calcium helps keep your bones strong and may help with any loss in bone density caused by the birth control shot. Ask your provider if you should take supplements. Contact a health care provider if you: Have discharge or bleeding from your vagina that isn't normal. Miss a period or think you might be pregnant. Have mood changes or depression. Feel dizzy or light-headed. Have leg pain. Get help right away if you: Have chest pain  or cough up blood. Have trouble breathing. Have a really bad headache that doesn't go away. Have numbness in any part of your body. Have really bad pain in your belly. Have slurred speech or vision problems. These symptoms may be an emergency. Call 911 right away. Do not wait to see if the symptoms will go away. Do not drive yourself to the hospital. This information is not intended to replace advice given to you by your health care provider. Make sure you  discuss any questions you have with your health care provider. Document Revised: 07/06/2023 Document Reviewed: 07/06/2023 Elsevier Patient Education  2024 [employer].

## 2024-08-20 DIAGNOSIS — F445 Conversion disorder with seizures or convulsions: Secondary | ICD-10-CM | POA: Diagnosis not present

## 2024-08-20 DIAGNOSIS — R569 Unspecified convulsions: Secondary | ICD-10-CM | POA: Diagnosis not present

## 2024-08-20 NOTE — ED Provider Notes (Signed)
 Received sign out from previous provider.  Updates ED Course as of 08/20/24 1747  Tue Aug 20, 2024  1527 Care assumed from previous physician to shift change.  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  Given benzodiazepine en route with resolution of symptoms.  Has been behaving at baseline in the emergency department has been seen by neurology.  Pending further recommendations.  1721 Patient has been cleared from neurology.  On reassessment, she is resting comfortably.  She reports she has been taking her Depakote  as directed but will follow-up with her neurology and psychiatry team regarding today's presentation and further management.  Return precautions reviewed.  Did discuss seizure precautions.      Portions of this record have been created using Scientist, clinical (histocompatibility and immunogenetics). Dictation errors have been sought, but may not have been identified and corrected.

## 2024-08-21 ENCOUNTER — Encounter: Payer: Self-pay | Admitting: Nurse Practitioner

## 2024-08-21 ENCOUNTER — Ambulatory Visit: Admitting: Nurse Practitioner

## 2024-08-21 VITALS — BP 123/77 | HR 90 | Temp 98.6°F

## 2024-08-21 DIAGNOSIS — F319 Bipolar disorder, unspecified: Secondary | ICD-10-CM | POA: Diagnosis not present

## 2024-08-21 DIAGNOSIS — F3181 Bipolar II disorder: Secondary | ICD-10-CM

## 2024-08-21 DIAGNOSIS — F445 Conversion disorder with seizures or convulsions: Secondary | ICD-10-CM | POA: Diagnosis not present

## 2024-08-21 DIAGNOSIS — Z79899 Other long term (current) drug therapy: Secondary | ICD-10-CM | POA: Diagnosis not present

## 2024-08-21 NOTE — Patient Instructions (Signed)
 Seizure, Adult A seizure is a sudden burst of abnormal activity in the brain. Seizures usually last from 30 seconds to 2 minutes. There are many types of seizures. And they can cause many different symptoms. What are the causes? Common causes of a seizure include: Fever or infection. Problems that affect the brain. These may include: A brain or head injury. A stroke. A brain tumor. Low levels of blood sugar or salt. Kidney problems or liver problems. Some inherited conditions. These are passed down from parent to child. Problems with a substance, such as: Having a reaction to a drug or a medicine. Stopping the use of a substance all of a sudden. When this causes problems, it's called withdrawal. Disorders that affect how you develop, such as autism spectrum disorder or cerebral palsy. Sometimes, the cause may not be known. Some people who have a seizure never have another one. A person who has repeated seizures over time without a clear cause has a condition called epilepsy. What increases the risk? Having a family history of epilepsy. Having had a tonic-clonic seizure before. This type of seizure causes: The muscles of the whole body to tighten, or contract. Loss of consciousness. Having a head injury or a stroke in the past. Having had too little oxygen at birth. What are the signs or symptoms? The symptoms vary depending on the type of seizure you have. Symptoms during a seizure Having convulsions. This means shaking with fast, jerky movements of muscles. Stiffness of the body. Breathing problems. Being confused. Staring or not responding to sound or touch. Head nodding, eye blinking, eye twitching, or fast eye movements. Drooling, grunting, or making clicking sounds with your mouth. Losing control of when you pee or poop. Symptoms before a seizure Feeling afraid, worried, or nervous. Feeling like you may vomit. Vertigo. This feels like: You are moving when you're  not. Things around you are moving when they're not. Dj vu. This is a feeling of having seen or heard something before. Odd tastes or smells. Changes in how you see. You may see flashing lights or spots. Symptoms after a seizure Being confused. Feeling sleepy. Headache. Sore muscles. How is this diagnosed? A seizure may be diagnosed based on: A description of your symptoms. Video of your seizures can be helpful. Your medical history. A physical exam. Tests, such as: Blood tests. CT scan. MRI. Electroencephalogram, or EEG. This test measures electrical activity in the brain. A test of your spinal fluid. This is called a spinal tap or lumbar puncture. How is this treated? If your seizure stops on its own, you will not need treatment. If your seizure lasts longer than 5 minutes, you'll normally need treatment. This may include: Medicines given through an IV. Avoiding things, such as medicines, that are known to cause your seizures. Medicines to prevent seizures. These are called antiepileptics. A device to prevent or control seizures. Eating foods that are low in carbohydrates and high in fat (ketogenic diet). Surgery. This is sometimes needed if you keep having seizures. Follow these instructions at home: Medicines Take your medicines only as told by your health care provider. Avoid anything that may keep your medicine from working, such as alcohol. Activity Follow your provider's advice about driving, swimming, and doing other things that would be dangerous if you had a seizure. Wait until your provider says it's safe for you to do these things. If you live in the [place] ask your local department of motor vehicles [facility]) when you can drive. Get  enough rest and sleep. Not getting enough sleep can make seizures more likely to happen. Teaching others  Teach friends and family what to do if you have a seizure. Tell them to: Help you get down to the ground safely. Protect your head  and body. Loosen any clothing around your neck. Turn you on your side. This helps keep your airway clear if you vomit. Know whether or not you need emergency care. Stay with you until you are better. Also, tell them what not to do if you have a seizure. Tell them: They should not hold you down. They should not put anything in your mouth. General instructions Avoid anything that has caused you to have seizures. Keep a seizure diary. Write down: What you remember about each seizure. What you think might have caused each seizure. Keep all follow-up visits. Your provider may need to monitor your progress. Contact a health care provider if: You have another seizure or seizures. Call each time you have a seizure. You have a change in how often or when you have seizures. You keep having seizures with treatment. You have symptoms of being sick or having an infection. You are not able to take your medicine. Get help right away if: You have or someone has seen you have: A seizure that lasts longer than 5 minutes. Many seizures in a row and you don't feel better between seizures. A seizure that makes it harder to breathe. A seizure that leaves you unable to speak or use a part of your body. You didn't wake up right away after a seizure. You injure yourself during a seizure. You have confusion or pain right after a seizure. These symptoms may be an emergency. Call 911 right away. Do not wait to see if the symptoms will go away. Do not drive yourself to the hospital. This information is not intended to replace advice given to you by your health care provider. Make sure you discuss any questions you have with your health care provider. Document Revised: 08/03/2023 Document Reviewed: 12/14/2022 Elsevier Patient Education  2024 [employer].

## 2024-08-21 NOTE — Progress Notes (Signed)
 BP 123/77 (BP Location: Left Arm, Patient Position: Sitting, Cuff Size: Normal)   Pulse 90   Temp 98.6 F (37 C) (Oral)    Subjective:    Patient ID: Erica Long New, female    DOB: 2001/09/01, 23 y.o.   MRN: [id number]  HPI: Erica Long is a 23 y.o. female  Chief Complaint  Patient presents with   ED visit follow up    Seizure-like activity (CMS-HCC) (Primary Dx) Discharge Disposition: Home with Self Care  2 seizures in last days. In some pain and feeling very fatigued.    ER FOLLOW UP Follow-up today for seizure activity recently, seen in ER for this on 08/20/24.  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  One seizure on Monday and then on on Tuesday.  Both witnessed, was placed on side by staff.  1st one was 10ish minutes and then 2nd one 23 minutes.  Feeling tired and sore today.  Did not fall down when had seizures, someone was able to get her down.  Sees neurology today at 12:30 pm.  Follows with psychiatry, last visit was one month ago.  Goes to therapy, in two weeks will have set therapy schedule.   Time since discharge: one day Hospital/facility: [facility] Diagnosis: seizure-like activity Procedures/tests: labs only -- alk phos and alt mildly low, valproic level low Consultants: none New medications: none Discharge instructions:  Follow-up with PCP and neurology Status: stable      08/21/2024   10:54 AM 07/17/2024    8:25 AM 09/19/2023    8:33 AM 09/11/2023    2:35 PM 08/09/2023   10:16 AM  Depression screen PHQ 2/9  Decreased Interest 2 3 3 1 2   Down, Depressed, Hopeless 2 3 3 1 1   PHQ - 2 Score 4 6 6 2 3   Altered sleeping 3 3 3 3 1   Tired, decreased energy 3 3 3 3 2   Change in appetite 3 3 3 3 3   Feeling bad or failure about yourself  3 3 3 2 2   Trouble concentrating 1 2 2 2 1   Moving slowly or fidgety/restless 3 2 2 3 2   Suicidal thoughts 1 1 3  0 0  PHQ-9 Score 21 23 25 18 14   Difficult doing work/chores Somewhat  difficult  Extremely dIfficult  Somewhat difficult       08/21/2024   10:54 AM 07/17/2024    8:25 AM 09/19/2023    8:33 AM 09/11/2023    2:35 PM  GAD 7 : Generalized Anxiety Score  Nervous, Anxious, on Edge 3 3 3 3   Control/stop worrying 3 3 3 3   Worry too much - different things 3 3 3 3   Trouble relaxing 3 3 3 2   Restless 3 2 2 1   Easily annoyed or irritable 3 3 2 1   Afraid - awful might happen 3 2 3 2   Total GAD 7 Score 21 19 19 15   Anxiety Difficulty Extremely difficult Somewhat difficult Extremely difficult    Relevant past medical, surgical, family and social history reviewed and updated as indicated. Interim medical history since our last visit reviewed. Allergies and medications reviewed and updated.  Review of Systems  Constitutional:  Positive for fatigue. Negative for activity change, appetite change, diaphoresis and fever.  Respiratory:  Negative for cough, chest tightness, shortness of breath and wheezing.   Cardiovascular:  Negative for chest pain, palpitations and leg swelling.  Gastrointestinal: Negative.  Neurological:  Positive for seizures. Negative for dizziness, tremors, facial asymmetry, speech difficulty, weakness and headaches.  Psychiatric/Behavioral: Negative.      Per HPI unless specifically indicated above     Objective:    BP 123/77 (BP Location: Left Arm, Patient Position: Sitting, Cuff Size: Normal)   Pulse 90   Temp 98.6 F (37 C) (Oral)   Wt Readings from Last 3 Encounters:  07/17/24 160 lb (72.6 kg)  09/19/23 143 lb 12.8 oz (65.2 kg)  09/11/23 145 lb 6.4 oz (66 kg)    Physical Exam Vitals and nursing note reviewed.  Constitutional:      General: She is awake. She is not in acute distress.    Appearance: She is well-developed and well-groomed. She is not ill-appearing or toxic-appearing.  HENT:     Head: Normocephalic.     Right Ear: Hearing and external ear normal.     Left Ear: Hearing and external ear normal.  Eyes:     General:  Lids are normal.        Right eye: No discharge.        Left eye: No discharge.     Conjunctiva/sclera: Conjunctivae normal.     Pupils: Pupils are equal, round, and reactive to light.  Neck:     Thyroid : No thyromegaly.     Vascular: No carotid bruit.  Cardiovascular:     Rate and Rhythm: Normal rate and regular rhythm.     Heart sounds: Normal heart sounds. No murmur heard.    No gallop.  Pulmonary:     Effort: Pulmonary effort is normal. No accessory muscle usage or respiratory distress.     Breath sounds: Normal breath sounds.  Abdominal:     General: Bowel sounds are normal. There is no distension.     Palpations: Abdomen is soft.     Tenderness: There is no abdominal tenderness.  Musculoskeletal:     Cervical back: Normal range of motion and neck supple.     Right lower leg: No edema.     Left lower leg: No edema.  Lymphadenopathy:     Cervical: No cervical adenopathy.  Skin:    General: Skin is warm and dry.  Neurological:     Mental Status: She is alert and oriented to person, place, and time.     Cranial Nerves: Cranial nerves 2-12 are intact.     Motor: Motor function is intact.     Coordination: Coordination is intact.     Gait: Gait is intact.     Deep Tendon Reflexes: Reflexes are normal and symmetric.     Reflex Scores:      Brachioradialis reflexes are 2+ on the right side and 2+ on the left side.      Patellar reflexes are 2+ on the right side and 2+ on the left side. Psychiatric:        Attention and Perception: Attention normal.        Mood and Affect: Mood normal.        Speech: Speech normal.        Behavior: Behavior normal. Behavior is cooperative.        Thought Content: Thought content normal.    Results for orders placed or performed in visit on 07/17/24  Pregnancy, urine (STAT)   Collection Time: 07/17/24  8:27 AM  Result Value Ref Range   Preg Test, Ur Negative Negative      Assessment & Plan:   Problem List Items Addressed  This Visit        Other   Psychogenic nonepileptic seizure   Ongoing with two episodes recently of seizure activity.  Overall labs in ER reassuring with a couple mild low levels.  She is being followed by [facility] neurology.  Continue this collaboration and Depakote  as ordered. Recent notes reviewed. She is scheduled to see them today, will defer changes to them.  Recommend she not drive at this time.      Bipolar 2 disorder (HCC) - Primary   Chronic, ongoing.  Followed by psychiatry.  Denies SI/HI. Continue medication regimen as prescribed by them.  Discussed with her at length and educated.  Has safety plan in place if suicidal ideation were to present, is aware to immediately go to ER if intrusive thoughts. Labs at physical in November.        Follow up plan: Return for as scheduled November 7th.

## 2024-08-21 NOTE — Assessment & Plan Note (Signed)
 Ongoing with two episodes recently of seizure activity.  Overall labs in ER reassuring with a couple mild low levels.  She is being followed by [facility] neurology.  Continue this collaboration and Depakote  as ordered. Recent notes reviewed. She is scheduled to see them today, will defer changes to them.  Recommend she not drive at this time.

## 2024-08-21 NOTE — Assessment & Plan Note (Signed)
 Chronic, ongoing.  Followed by psychiatry.  Denies SI/HI. Continue medication regimen as prescribed by them.  Discussed with her at length and educated.  Has safety plan in place if suicidal ideation were to present, is aware to immediately go to ER if intrusive thoughts. Labs at physical in November.

## 2024-08-22 ENCOUNTER — Telehealth: Payer: Self-pay

## 2024-08-22 DIAGNOSIS — R569 Unspecified convulsions: Secondary | ICD-10-CM | POA: Diagnosis not present

## 2024-08-22 NOTE — Telephone Encounter (Signed)
Routing to provider to advise on medication question

## 2024-08-22 NOTE — Telephone Encounter (Signed)
 Copied from CRM #[id number]. Topic: Clinical - Medication Question >> Aug 22, 2024 10:39 AM Tobias L wrote: Reason for CRM: Patient inquiring if safe to take ativan  up to 3 times daily, patient has taken one dosage atarax  25mg  already today.   Patient requesting callback with clarification on safe dosage, [phone]

## 2024-08-23 NOTE — Telephone Encounter (Signed)
My chart message with information sent to patient

## 2024-08-30 DIAGNOSIS — F410 Panic disorder [episodic paroxysmal anxiety] without agoraphobia: Secondary | ICD-10-CM | POA: Diagnosis not present

## 2024-08-30 DIAGNOSIS — F3181 Bipolar II disorder: Secondary | ICD-10-CM | POA: Diagnosis not present

## 2024-08-30 DIAGNOSIS — F4312 Post-traumatic stress disorder, chronic: Secondary | ICD-10-CM | POA: Diagnosis not present

## 2024-09-13 DIAGNOSIS — F4312 Post-traumatic stress disorder, chronic: Secondary | ICD-10-CM | POA: Diagnosis not present

## 2024-09-13 DIAGNOSIS — F410 Panic disorder [episodic paroxysmal anxiety] without agoraphobia: Secondary | ICD-10-CM | POA: Diagnosis not present

## 2024-09-13 DIAGNOSIS — F3181 Bipolar II disorder: Secondary | ICD-10-CM | POA: Diagnosis not present

## 2024-09-14 NOTE — Patient Instructions (Signed)

## 2024-09-20 ENCOUNTER — Encounter: Payer: Self-pay | Admitting: Nurse Practitioner

## 2024-09-20 ENCOUNTER — Ambulatory Visit (INDEPENDENT_AMBULATORY_CARE_PROVIDER_SITE_OTHER): Admitting: Nurse Practitioner

## 2024-09-20 VITALS — BP 96/67 | HR 88 | Temp 98.0°F | Resp 98 | Ht 63.0 in | Wt 160.0 lb

## 2024-09-20 DIAGNOSIS — Z789 Other specified health status: Secondary | ICD-10-CM

## 2024-09-20 DIAGNOSIS — F410 Panic disorder [episodic paroxysmal anxiety] without agoraphobia: Secondary | ICD-10-CM

## 2024-09-20 DIAGNOSIS — Z13220 Encounter for screening for lipoid disorders: Secondary | ICD-10-CM

## 2024-09-20 DIAGNOSIS — Z0000 Encounter for general adult medical examination without abnormal findings: Secondary | ICD-10-CM

## 2024-09-20 DIAGNOSIS — Z136 Encounter for screening for cardiovascular disorders: Secondary | ICD-10-CM

## 2024-09-20 DIAGNOSIS — F445 Conversion disorder with seizures or convulsions: Secondary | ICD-10-CM | POA: Diagnosis not present

## 2024-09-20 DIAGNOSIS — F3181 Bipolar II disorder: Secondary | ICD-10-CM | POA: Diagnosis not present

## 2024-09-20 LAB — PREGNANCY, URINE: Preg Test, Ur: NEGATIVE

## 2024-09-20 MED ORDER — VALACYCLOVIR HCL 1 G PO TABS
1000.0000 mg | ORAL_TABLET | Freq: Every day | ORAL | 3 refills | Status: AC
Start: 2024-09-20 — End: ?

## 2024-09-20 NOTE — Assessment & Plan Note (Signed)
 Chronic, ongoing.  Followed by psychiatry.  Denies SI/HI. Continue medication regimen as prescribed by them.  Discussed with her at length and educated.  Has safety plan in place if suicidal ideation were to present, is aware to immediately go to ER if intrusive thoughts. Labs today.

## 2024-09-20 NOTE — Assessment & Plan Note (Signed)
Refer to Bipolar plan of care. 

## 2024-09-20 NOTE — Assessment & Plan Note (Signed)
 Ongoing and follows with neurology.  Check labs today.  She is being followed by [facility] neurology.  Continue neurology collaboration and Depakote  as ordered. Recent notes reviewed. Recommend she not drive at this time.

## 2024-09-20 NOTE — Progress Notes (Signed)
 BP 96/67   Pulse 88   Temp 98 F (36.7 C) (Oral)   Resp (!) 98   Ht 5' 3 (1.6 m)   Wt 160 lb (72.6 kg)   BMI 28.34 kg/m    Subjective:    Patient ID: Erica Long, female    DOB: 2001/02/03, 23 y.o.   MRN: [id number]  HPI: Erica Long is a 23 y.o. female presenting on 09/20/2024 for comprehensive medical examination. Current medical complaints include:none  She currently lives with: family Menopausal Symptoms: no  BIPOLAR DISORDER AND SEIZURES Follows with [facility] for psychiatry, sees them next Friday. Continues on Depakote , Vraylar, Ativan . Saw therapist yesterday and psychiatry last week. Started EMDR. Neurology visit on 08/21/24 last for seizures.  She reports EMS came twice last week and at one point had abnormal EKG, would like it checked here today. She reports they told her she had a-fib. Mood status: exacerbated Satisfied with current treatment?: yes Symptom severity: moderate  Duration of current treatment : chronic Side effects: no Medication compliance: good compliance Psychotherapy/counseling: yes in the past Depressed mood: yes Anxious mood: yes Anhedonia: no Significant weight loss or gain: no Insomnia: sleeping too much Fatigue: yes Feelings of worthlessness or guilt: sometimes Impaired concentration/indecisiveness: no Suicidal ideations: no -- she denies any recent thoughts Hopelessness: yes Crying spells: yes    09/20/2024   10:32 AM 08/21/2024   10:54 AM 07/17/2024    8:25 AM 09/19/2023    8:33 AM 09/11/2023    2:35 PM  Depression screen PHQ 2/9  Decreased Interest 3 2 3 3 1   Down, Depressed, Hopeless 3 2 3 3 1   PHQ - 2 Score 6 4 6 6 2   Altered sleeping 3 3 3 3 3   Tired, decreased energy 3 3 3 3 3   Change in appetite 3 3 3 3 3   Feeling bad or failure about yourself  3 3 3 3 2   Trouble concentrating 2 1 2 2 2   Moving slowly or fidgety/restless 3 3 2 2 3   Suicidal thoughts 1 1 1 3  0  PHQ-9 Score 24 21  23  25  18    Difficult doing work/chores  Very difficult Somewhat difficult  Extremely dIfficult      Data saved with a previous flowsheet row definition      09/20/2024   10:32 AM 08/21/2024   10:54 AM 07/17/2024    8:25 AM 09/19/2023    8:33 AM  GAD 7 : Generalized Anxiety Score  Nervous, Anxious, on Edge 3 3 3 3   Control/stop worrying 3 3 3 3   Worry too much - different things 3 3 3 3   Trouble relaxing 3 3 3 3   Restless 3 3 2 2   Easily annoyed or irritable 3 3 3 2   Afraid - awful might happen 3 3 2 3   Total GAD 7 Score 21 21 19 19   Anxiety Difficulty Very difficult Extremely difficult Somewhat difficult Extremely difficult      05/30/2023    4:34 PM 07/12/2023    9:58 AM 08/09/2023   10:16 AM 09/19/2023    8:32 AM 08/21/2024   10:53 AM  Fall Risk  Falls in the past year? 0 0 0 0 0  Was there an injury with Fall? 0 0 0 0 0  Fall Risk Category Calculator 0 0 0 0 0  Patient at Risk for Falls Due to No Fall Risks No Fall Risks No Fall Risks No Fall Risks No Fall Risks  Fall risk Follow up Falls evaluation completed Falls evaluation completed Falls evaluation completed Falls evaluation completed Falls evaluation completed   Past Medical History:  Past Medical History:  Diagnosis Date   Bipolar 1 disorder (HCC)    HSV (herpes simplex virus) infection     Surgical History:  Past Surgical History:  Procedure Laterality Date   WISDOM TOOTH EXTRACTION      Medications:  Current Outpatient Medications on File Prior to Visit  Medication Sig   cetirizine  (ZYRTEC ) 10 MG tablet Take 10 mg by mouth daily as needed.   divalproex  (DEPAKOTE ) 500 MG DR tablet Take 500 mg by mouth 2 (two) times daily. (Patient taking differently: Take by mouth. Taking 750 mg in the morning and 1000 mg at night)   fluticasone (FLONASE) 50 MCG/ACT nasal spray Place 1 spray into both nostrils daily as needed for rhinitis or allergies.   LORazepam  (ATIVAN ) 0.5 MG tablet Take 0.5 mg by mouth 3 (three) times daily as needed for anxiety.   VRAYLAR 4.5 MG  CAPS Take 1 capsule by mouth daily.   Current Facility-Administered Medications on File Prior to Visit  Medication   medroxyPROGESTERone  (DEPO-PROVERA ) injection 150 mg    Allergies:  Allergies  Allergen Reactions   Lamotrigine Hives    Social History:  Social History   Socioeconomic History   Marital status: Single    Spouse name: Not on file   Number of children: Not on file   Years of education: Not on file   Highest education level: Not on file  Occupational History   Not on file  Tobacco Use   Smoking status: Never   Smokeless tobacco: Never  Vaping Use   Vaping status: Every Day   Substances: Nicotine  Substance and Sexual Activity   Alcohol use: Yes    Comment: on occasion   Drug use: Not Currently    Types: Marijuana   Sexual activity: Not Currently  Other Topics Concern   Not on file  Social History Narrative   Not on file   Social Drivers of Health   Financial Resource Strain: Low Risk  (08/10/2023)   Received from [facility]   Overall Financial Resource Strain (CARDIA)    Difficulty of Paying Living Expenses: Not hard at all  Food Insecurity: No Food Insecurity (08/10/2023)   Received from [facility]   Hunger Vital Sign    Within the past 12 months, you worried that your food would run out before you got the money to buy more.: Never true    Within the past 12 months, the food you bought just didn't last and you didn't have money to get more.: Never true  Transportation Needs: No Transportation Needs (08/10/2023)   Received from [facility]   PRAPARE - Transportation    Lack of Transportation (Medical): No    Lack of Transportation (Non-Medical): No  Physical Activity: Not on file  Stress: Not on file  Social Connections: Not on file  Intimate Partner Violence: Not on file   Social History   Tobacco Use  Smoking Status Never  Smokeless Tobacco Never   Social History   Substance and Sexual Activity  Alcohol Use Yes    Comment: on occasion    Family History:  Family History  Problem Relation Age of Onset   Bipolar disorder Mother    Depression Mother    Heart attack Maternal Grandmother    COPD Paternal Grandmother    Depression Paternal Grandmother  Past medical history, surgical history, medications, allergies, family history and social history reviewed with patient today and changes made to appropriate areas of the chart.   ROS All other ROS negative except what is listed above and in the HPI.      Objective:    BP 96/67   Pulse 88   Temp 98 F (36.7 C) (Oral)   Resp (!) 98   Ht 5' 3 (1.6 m)   Wt 160 lb (72.6 kg)   BMI 28.34 kg/m   Wt Readings from Last 3 Encounters:  09/20/24 160 lb (72.6 kg)  07/17/24 160 lb (72.6 kg)  09/19/23 143 lb 12.8 oz (65.2 kg)    Physical Exam Vitals and nursing note reviewed. Exam conducted with a chaperone present.  Constitutional:      General: She is awake. She is not in acute distress.    Appearance: She is well-developed and well-groomed. She is not ill-appearing or toxic-appearing.  HENT:     Head: Normocephalic and atraumatic.     Right Ear: Hearing, tympanic membrane, ear canal and external ear normal. No drainage.     Left Ear: Hearing, tympanic membrane, ear canal and external ear normal. No drainage.     Nose: Nose normal.     Right Sinus: No maxillary sinus tenderness or frontal sinus tenderness.     Left Sinus: No maxillary sinus tenderness or frontal sinus tenderness.     Mouth/Throat:     Mouth: Mucous membranes are moist.     Pharynx: Oropharynx is clear. Uvula midline. No pharyngeal swelling, oropharyngeal exudate or posterior oropharyngeal erythema.  Eyes:     General: Lids are normal.        Right eye: No discharge.        Left eye: No discharge.     Extraocular Movements: Extraocular movements intact.     Conjunctiva/sclera: Conjunctivae normal.     Pupils: Pupils are equal, round, and reactive to light.     Visual  Fields: Right eye visual fields normal and left eye visual fields normal.  Neck:     Thyroid : No thyromegaly.     Vascular: No carotid bruit.     Trachea: Trachea normal.  Cardiovascular:     Rate and Rhythm: Normal rate and regular rhythm.     Heart sounds: Normal heart sounds. No murmur heard.    No gallop.  Pulmonary:     Effort: Pulmonary effort is normal. No accessory muscle usage or respiratory distress.     Breath sounds: Normal breath sounds.  Chest:  Breasts:    Right: Normal.     Left: Normal.  Abdominal:     General: Bowel sounds are normal.     Palpations: Abdomen is soft. There is no hepatomegaly or splenomegaly.     Tenderness: There is no abdominal tenderness.  Musculoskeletal:        General: Normal range of motion.     Cervical back: Normal range of motion and neck supple.     Right lower leg: No edema.     Left lower leg: No edema.  Lymphadenopathy:     Head:     Right side of head: No submental, submandibular, tonsillar, preauricular or posterior auricular adenopathy.     Left side of head: No submental, submandibular, tonsillar, preauricular or posterior auricular adenopathy.     Cervical: No cervical adenopathy.     Upper Body:     Right upper body: No supraclavicular, axillary or pectoral adenopathy.  Left upper body: No supraclavicular, axillary or pectoral adenopathy.  Skin:    General: Skin is warm and dry.     Capillary Refill: Capillary refill takes less than 2 seconds.     Findings: No rash.  Neurological:     Mental Status: She is alert and oriented to person, place, and time.     Gait: Gait is intact.     Deep Tendon Reflexes: Reflexes are normal and symmetric.     Reflex Scores:      Brachioradialis reflexes are 2+ on the right side and 2+ on the left side.      Patellar reflexes are 2+ on the right side and 2+ on the left side. Psychiatric:        Attention and Perception: Attention normal.        Mood and Affect: Mood normal.         Speech: Speech normal.        Behavior: Behavior normal. Behavior is cooperative.        Thought Content: Thought content normal.        Judgment: Judgment normal.   EKG My review and personal interpretation at Time: 1100    Indication: report of being told she had a-fib  Rate: 74  Rhythm: sinus Axis: normal Other: No nonspecific st abn/elevation, no stemi, no lvh, no a fib  Results for orders placed or performed in visit on 07/17/24  Pregnancy, urine (STAT)   Collection Time: 07/17/24  8:27 AM  Result Value Ref Range   Preg Test, Ur Negative Negative      Assessment & Plan:   Problem List Items Addressed This Visit       Other   Uses Depo-Provera  as primary birth control method   Urine pregnancy testing today was negative.  Check CMP and lipid panel for annual check.  Depo due the beginning of December.      Relevant Orders   Pregnancy, urine   Psychogenic nonepileptic seizure   Ongoing and follows with neurology.  Check labs today.  She is being followed by [facility] neurology.  Continue neurology collaboration and Depakote  as ordered. Recent notes reviewed. Recommend she not drive at this time.      Relevant Orders   EKG 12-Lead   Panic disorder (episodic paroxysmal anxiety)   Refer to Bipolar plan of care.      Bipolar 2 disorder (HCC) - Primary   Chronic, ongoing.  Followed by psychiatry.  Denies SI/HI. Continue medication regimen as prescribed by them.  Discussed with her at length and educated.  Has safety plan in place if suicidal ideation were to present, is aware to immediately go to ER if intrusive thoughts. Labs today.      Relevant Orders   CBC with Differential/Platelet   Comprehensive metabolic panel with GFR   TSH   Valproic Acid  level   Other Visit Diagnoses       Encounter for lipid screening for cardiovascular disease       Lipid panel today   Relevant Orders   Lipid Panel w/o Chol/HDL Ratio     Encounter for annual physical exam       Annual  physical today with labs and health maintenance reviewed, discussed with patient.        Follow up plan: Return in about 6 months (around 03/20/2025) for Depression, ANXIETY, Seizures.   LABORATORY TESTING:  - Pap smear: up to date  IMMUNIZATIONS:   - Tdap: Tetanus vaccination status  reviewed: last tetanus booster within 10 years. - Influenza: Up to date - Pneumovax: Not applicable - Prevnar: Not applicable - COVID: Refused - HPV: Refused - Shingrix vaccine: Not applicable  SCREENING: -Mammogram: Not applicable  - Colonoscopy: Not applicable  - Bone Density: Not applicable  -Hearing Test: Not applicable  -Spirometry: Not applicable   PATIENT COUNSELING:   Advised to take 1 mg of folate supplement per day if capable of pregnancy.   Sexuality: Discussed sexually transmitted diseases, partner selection, use of condoms, avoidance of unintended pregnancy  and contraceptive alternatives.   Advised to avoid cigarette smoking.  I discussed with the patient that most people either abstain from alcohol or drink within safe limits (<=14/week and <=4 drinks/occasion for males, <=7/weeks and <= 3 drinks/occasion for females) and that the risk for alcohol disorders and other health effects rises proportionally with the number of drinks per week and how often a drinker exceeds daily limits.  Discussed cessation/primary prevention of drug use and availability of treatment for abuse.   Diet: Encouraged to adjust caloric intake to maintain  or achieve ideal body weight, to reduce intake of dietary saturated fat and total fat, to limit sodium intake by avoiding high sodium foods and not adding table salt, and to maintain adequate dietary potassium and calcium preferably from fresh fruits, vegetables, and low-fat dairy products.    Stressed the importance of regular exercise  Injury prevention: Discussed safety belts, safety helmets, smoke detector, smoking near bedding or upholstery.   Dental  health: Discussed importance of regular tooth brushing, flossing, and dental visits.    NEXT PREVENTATIVE PHYSICAL DUE IN 1 YEAR. Return in about 6 months (around 03/20/2025) for Depression, ANXIETY, Seizures.

## 2024-09-20 NOTE — Assessment & Plan Note (Signed)
 Urine pregnancy testing today was negative.  Check CMP and lipid panel for annual check.  Depo due the beginning of December.

## 2024-09-21 ENCOUNTER — Ambulatory Visit: Payer: Self-pay | Admitting: Nurse Practitioner

## 2024-09-21 LAB — LIPID PANEL W/O CHOL/HDL RATIO
Cholesterol, Total: 127 mg/dL (ref 100–199)
HDL: 51 mg/dL (ref >39)
LDL Chol Calc (NIH): 63 mg/dL (ref 0–99)
Triglycerides: 60 mg/dL (ref 0–149)
VLDL Cholesterol Cal: 13 mg/dL (ref 5–40)

## 2024-09-21 LAB — COMPREHENSIVE METABOLIC PANEL WITH GFR
ALT: 8 IU/L (ref 0–32)
AST: 13 IU/L (ref 0–40)
Albumin: 4.6 g/dL (ref 4.0–5.0)
Alkaline Phosphatase: 42 IU/L (ref 41–116)
BUN/Creatinine Ratio: 14 (ref 9–23)
BUN: 10 mg/dL (ref 6–20)
Bilirubin Total: 0.3 mg/dL (ref 0.0–1.2)
CO2: 21 mmol/L (ref 20–29)
Calcium: 9.7 mg/dL (ref 8.7–10.2)
Chloride: 105 mmol/L (ref 96–106)
Creatinine, Ser: 0.69 mg/dL (ref 0.57–1.00)
Globulin, Total: 2.6 g/dL (ref 1.5–4.5)
Glucose: 71 mg/dL (ref 70–99)
Potassium: 4.2 mmol/L (ref 3.5–5.2)
Sodium: 141 mmol/L (ref 134–144)
Total Protein: 7.2 g/dL (ref 6.0–8.5)
eGFR: 124 mL/min/1.73

## 2024-09-21 LAB — CBC WITH DIFFERENTIAL/PLATELET
Basophils Absolute: 0.1 x10E3/uL (ref 0.0–0.2)
Basos: 1 %
EOS (ABSOLUTE): 0.1 x10E3/uL (ref 0.0–0.4)
Eos: 1 %
Hematocrit: 41.4 % (ref 34.0–46.6)
Hemoglobin: 13.6 g/dL (ref 11.1–15.9)
Immature Grans (Abs): 0 x10E3/uL (ref 0.0–0.1)
Immature Granulocytes: 0 %
Lymphocytes Absolute: 1.7 x10E3/uL (ref 0.7–3.1)
Lymphs: 18 %
MCH: 31.9 pg (ref 26.6–33.0)
MCHC: 32.9 g/dL (ref 31.5–35.7)
MCV: 97 fL (ref 79–97)
Monocytes Absolute: 0.7 x10E3/uL (ref 0.1–0.9)
Monocytes: 8 %
Neutrophils Absolute: 6.6 x10E3/uL (ref 1.4–7.0)
Neutrophils: 72 %
Platelets: 257 x10E3/uL (ref 150–450)
RBC: 4.26 x10E6/uL (ref 3.77–5.28)
RDW: 11.8 % (ref 11.7–15.4)
WBC: 9.1 x10E3/uL (ref 3.4–10.8)

## 2024-09-21 LAB — TSH: TSH: 1.99 u[IU]/mL (ref 0.450–4.500)

## 2024-09-21 LAB — VALPROIC ACID LEVEL: Valproic Acid Lvl: 53 ug/mL (ref 50–100)

## 2024-09-21 NOTE — Progress Notes (Signed)
 Contacted via MyChart  Good morning Lora, your labs have returned and everything looks great. Any questions? Keep being amazing!!  Thank you for allowing me to participate in your care.  I appreciate you. Kindest regards, Doyle Tegethoff

## 2024-09-25 DIAGNOSIS — R569 Unspecified convulsions: Secondary | ICD-10-CM | POA: Diagnosis not present

## 2024-09-25 DIAGNOSIS — H538 Other visual disturbances: Secondary | ICD-10-CM | POA: Diagnosis not present

## 2024-09-25 DIAGNOSIS — R531 Weakness: Secondary | ICD-10-CM | POA: Diagnosis not present

## 2024-09-25 DIAGNOSIS — G40909 Epilepsy, unspecified, not intractable, without status epilepticus: Secondary | ICD-10-CM | POA: Diagnosis not present

## 2024-09-25 DIAGNOSIS — R0689 Other abnormalities of breathing: Secondary | ICD-10-CM | POA: Diagnosis not present

## 2024-09-26 DIAGNOSIS — R569 Unspecified convulsions: Secondary | ICD-10-CM | POA: Diagnosis not present

## 2024-09-27 DIAGNOSIS — F3181 Bipolar II disorder: Secondary | ICD-10-CM | POA: Diagnosis not present

## 2024-09-27 DIAGNOSIS — F410 Panic disorder [episodic paroxysmal anxiety] without agoraphobia: Secondary | ICD-10-CM | POA: Diagnosis not present

## 2024-09-27 DIAGNOSIS — F4312 Post-traumatic stress disorder, chronic: Secondary | ICD-10-CM | POA: Diagnosis not present

## 2024-09-28 NOTE — Patient Instructions (Signed)
 Seizure, Adult A seizure is a sudden burst of abnormal activity in the brain. Seizures usually last from 30 seconds to 2 minutes. There are many types of seizures. And they can cause many different symptoms. What are the causes? Common causes of a seizure include: Fever or infection. Problems that affect the brain. These may include: A brain or head injury. A stroke. A brain tumor. Low levels of blood sugar or salt. Kidney problems or liver problems. Some inherited conditions. These are passed down from parent to child. Problems with a substance, such as: Having a reaction to a drug or a medicine. Stopping the use of a substance all of a sudden. When this causes problems, it's called withdrawal. Disorders that affect how you develop, such as autism spectrum disorder or cerebral palsy. Sometimes, the cause may not be known. Some people who have a seizure never have another one. A person who has repeated seizures over time without a clear cause has a condition called epilepsy. What increases the risk? Having a family history of epilepsy. Having had a tonic-clonic seizure before. This type of seizure causes: The muscles of the whole body to tighten, or contract. Loss of consciousness. Having a head injury or a stroke in the past. Having had too little oxygen at birth. What are the signs or symptoms? The symptoms vary depending on the type of seizure you have. Symptoms during a seizure Having convulsions. This means shaking with fast, jerky movements of muscles. Stiffness of the body. Breathing problems. Being confused. Staring or not responding to sound or touch. Head nodding, eye blinking, eye twitching, or fast eye movements. Drooling, grunting, or making clicking sounds with your mouth. Losing control of when you pee or poop. Symptoms before a seizure Feeling afraid, worried, or nervous. Feeling like you may vomit. Vertigo. This feels like: You are moving when you're  not. Things around you are moving when they're not. Dj vu. This is a feeling of having seen or heard something before. Odd tastes or smells. Changes in how you see. You may see flashing lights or spots. Symptoms after a seizure Being confused. Feeling sleepy. Headache. Sore muscles. How is this diagnosed? A seizure may be diagnosed based on: A description of your symptoms. Video of your seizures can be helpful. Your medical history. A physical exam. Tests, such as: Blood tests. CT scan. MRI. Electroencephalogram, or EEG. This test measures electrical activity in the brain. A test of your spinal fluid. This is called a spinal tap or lumbar puncture. How is this treated? If your seizure stops on its own, you will not need treatment. If your seizure lasts longer than 5 minutes, you'll normally need treatment. This may include: Medicines given through an IV. Avoiding things, such as medicines, that are known to cause your seizures. Medicines to prevent seizures. These are called antiepileptics. A device to prevent or control seizures. Eating foods that are low in carbohydrates and high in fat (ketogenic diet). Surgery. This is sometimes needed if you keep having seizures. Follow these instructions at home: Medicines Take your medicines only as told by your health care provider. Avoid anything that may keep your medicine from working, such as alcohol. Activity Follow your provider's advice about driving, swimming, and doing other things that would be dangerous if you had a seizure. Wait until your provider says it's safe for you to do these things. If you live in the [place] ask your local department of motor vehicles [facility]) when you can drive. Get  enough rest and sleep. Not getting enough sleep can make seizures more likely to happen. Teaching others  Teach friends and family what to do if you have a seizure. Tell them to: Help you get down to the ground safely. Protect your head  and body. Loosen any clothing around your neck. Turn you on your side. This helps keep your airway clear if you vomit. Know whether or not you need emergency care. Stay with you until you are better. Also, tell them what not to do if you have a seizure. Tell them: They should not hold you down. They should not put anything in your mouth. General instructions Avoid anything that has caused you to have seizures. Keep a seizure diary. Write down: What you remember about each seizure. What you think might have caused each seizure. Keep all follow-up visits. Your provider may need to monitor your progress. Contact a health care provider if: You have another seizure or seizures. Call each time you have a seizure. You have a change in how often or when you have seizures. You keep having seizures with treatment. You have symptoms of being sick or having an infection. You are not able to take your medicine. Get help right away if: You have or someone has seen you have: A seizure that lasts longer than 5 minutes. Many seizures in a row and you don't feel better between seizures. A seizure that makes it harder to breathe. A seizure that leaves you unable to speak or use a part of your body. You didn't wake up right away after a seizure. You injure yourself during a seizure. You have confusion or pain right after a seizure. These symptoms may be an emergency. Call 911 right away. Do not wait to see if the symptoms will go away. Do not drive yourself to the hospital. This information is not intended to replace advice given to you by your health care provider. Make sure you discuss any questions you have with your health care provider. Document Revised: 08/03/2023 Document Reviewed: 12/14/2022 Elsevier Patient Education  2024 [employer].

## 2024-10-02 ENCOUNTER — Ambulatory Visit

## 2024-10-02 ENCOUNTER — Encounter: Payer: Self-pay | Admitting: Nurse Practitioner

## 2024-10-02 ENCOUNTER — Ambulatory Visit: Admitting: Nurse Practitioner

## 2024-10-02 VITALS — BP 120/72 | HR 86 | Temp 98.9°F | Resp 17 | Ht 64.02 in | Wt 157.0 lb

## 2024-10-02 DIAGNOSIS — J301 Allergic rhinitis due to pollen: Secondary | ICD-10-CM

## 2024-10-02 DIAGNOSIS — F3181 Bipolar II disorder: Secondary | ICD-10-CM | POA: Diagnosis not present

## 2024-10-02 DIAGNOSIS — F445 Conversion disorder with seizures or convulsions: Secondary | ICD-10-CM | POA: Diagnosis not present

## 2024-10-02 NOTE — Assessment & Plan Note (Signed)
 Ongoing and follows with neurology.  Check labs today: Depakote  level.  She is being followed by [facility] neurology.  Continue neurology collaboration and Depakote  as ordered. Recent notes reviewed. Recommend she not drive at this time. Highly recommend she scheduled follow-up with neurology so they can further determine medication needs, she is allergic the Lamictal and Depakote  does not appear to be offering benefit.

## 2024-10-02 NOTE — Progress Notes (Signed)
 BP 120/72 (BP Location: Left Arm, Patient Position: Sitting, Cuff Size: Normal)   Pulse 86   Temp 98.9 F (37.2 C) (Oral)   Resp 17   Ht 5' 4.02 (1.626 m)   Wt 157 lb (71.2 kg)   SpO2 98%   BMI 26.94 kg/m    Subjective:    Patient ID: Erica Long, female    DOB: [birth year], 23 y.o.   MRN: [id number]  HPI: Erica Long is a 23 y.o. female  Chief Complaint  Patient presents with   Hospitalization Follow-up    No concerns. Had cut out caffeine since her last visit when instructed.    ER FOLLOW UP Follow-up today due to recent [facility] ER visit on 09/25/24 due to seizure activity. She is followed by neurology for seizures, last visit was 08/21/24. Taking Depakote  750 MG in the morning and 1000 MG at night presently. She continues to follow with psychiatry and takes Ativan  PRN and Vraylar 3 MG daily (recent reduction with psychiatry). She does not recall what happened the day of the seizure, was at work and seizure was witnessed. Was given Versed with EMS. Since the 12th has had no further seizure activity. Depakote  level 31 in ER, lower side.  Sees psychiatry next November 28th.  Has been sick since Sunday, did Covid test at home which was negative.  Time since discharge: 7 days ago Hospital/facility: [facility] Diagnosis: Seizure Procedures/tests: Labs Consultants: none Long medications: none Discharge instructions: To follow-up with PCP and neurology Status: stable      10/02/2024   10:11 AM 09/20/2024   10:32 AM 08/21/2024   10:54 AM 07/17/2024    8:25 AM 09/19/2023    8:33 AM  Depression screen PHQ 2/9  Decreased Interest 3 3 2 3 3  Down, Depressed, Hopeless 3 3 2 3 3  PHQ - 2 Score 6 6 4 6 6  Altered sleeping 3 3 3 3 3  Tired, decreased energy 3 3 3 3 3  Change in appetite 3 3 3 3 3  Feeling bad or failure about yourself  3 3 3 3 3  Trouble concentrating 2 2 1 2 2  Moving slowly or fidgety/restless 3 3 3 2 2  Suicidal thoughts 0 1 1 1 3  PHQ-9 Score 23 24 21  23  25    Difficult doing work/chores Somewhat difficult Very difficult Somewhat difficult  Extremely dIfficult     Data saved with a previous flowsheet row definition       11 /19/2025   10:11 AM 09/20/2024   10:32 AM 08/21/2024   10:54 AM 07/17/2024    8:25 AM  GAD 7 : Generalized Anxiety Score  Nervous, Anxious, on Edge 3 3 3 3   Control/stop worrying 3 3 3 3   Worry too much - different things 3 3 3 3   Trouble relaxing 3 3 3 3   Restless 3 3 3 2   Easily annoyed or irritable 3 3 3 3   Afraid - awful might happen 3 3 3 2   Total GAD 7 Score 21 21 21 19   Anxiety Difficulty Somewhat difficult Very difficult Extremely difficult Somewhat difficult   Relevant past medical, surgical, family and social history reviewed and updated as indicated. Interim medical history since our last visit reviewed. Allergies and medications reviewed and updated.  Review of Systems  Constitutional:  Negative for activity change, appetite change, chills, fatigue and fever.  HENT:  Positive for congestion, ear pain, postnasal drip, rhinorrhea and sore  throat (only in the morning). Negative for ear discharge, sinus pressure and sinus pain.   Respiratory:  Positive for cough. Negative for chest tightness, shortness of breath and wheezing.   Cardiovascular:  Negative for chest pain, palpitations and leg swelling.  Neurological:  Positive for seizures. Negative for dizziness, facial asymmetry, weakness, light-headedness and headaches.  Psychiatric/Behavioral: Negative.     Per HPI unless specifically indicated above     Objective:    BP 120/72 (BP Location: Left Arm, Patient Position: Sitting, Cuff Size: Normal)   Pulse 86   Temp 98.9 F (37.2 C) (Oral)   Resp 17   Ht 5' 4.02 (1.626 m)   Wt 157 lb (71.2 kg)   SpO2 98%   BMI 26.94 kg/m   Wt Readings from Last 3 Encounters:  10/02/24 157 lb (71.2 kg)  09/20/24 160 lb (72.6 kg)  07/17/24 160 lb (72.6 kg)    Physical Exam Vitals and nursing note reviewed.   Constitutional:      General: She is awake. She is not in acute distress.    Appearance: She is well-developed and well-groomed. She is not ill-appearing or toxic-appearing.  HENT:     Head: Normocephalic.     Right Ear: Hearing, ear canal and external ear normal. A middle ear effusion is present. There is no impacted cerumen. Tympanic membrane is not injected or perforated.     Left Ear: Hearing, ear canal and external ear normal. A middle ear effusion is present. There is no impacted cerumen. Tympanic membrane is not injected or perforated.     Nose: Rhinorrhea present. Rhinorrhea is clear.     Right Sinus: No maxillary sinus tenderness or frontal sinus tenderness.     Left Sinus: No maxillary sinus tenderness or frontal sinus tenderness.     Mouth/Throat:     Mouth: Mucous membranes are moist.     Pharynx: Posterior oropharyngeal erythema (mild) and postnasal drip present. No pharyngeal swelling or oropharyngeal exudate.     Tonsils: No tonsillar exudate. 2+ on the right. 2+ on the left.  Eyes:     General: Lids are normal.        Right eye: No discharge.        Left eye: No discharge.     Conjunctiva/sclera: Conjunctivae normal.     Pupils: Pupils are equal, round, and reactive to light.  Neck:     Thyroid : No thyromegaly.     Vascular: No carotid bruit.  Cardiovascular:     Rate and Rhythm: Normal rate and regular rhythm.     Heart sounds: Normal heart sounds. No murmur heard.    No gallop.  Pulmonary:     Effort: Pulmonary effort is normal. No accessory muscle usage or respiratory distress.     Breath sounds: Normal breath sounds.  Abdominal:     General: Bowel sounds are normal. There is no distension.     Palpations: Abdomen is soft.     Tenderness: There is no abdominal tenderness.  Musculoskeletal:     Cervical back: Normal range of motion and neck supple.     Right lower leg: No edema.     Left lower leg: No edema.  Lymphadenopathy:     Cervical: No cervical  adenopathy.  Skin:    General: Skin is warm and dry.  Neurological:     Mental Status: She is alert and oriented to person, place, and time.     Cranial Nerves: Cranial nerves 2-12 are intact.  Motor: Motor function is intact.     Coordination: Coordination is intact.     Gait: Gait is intact.     Deep Tendon Reflexes: Reflexes are normal and symmetric.     Reflex Scores:      Brachioradialis reflexes are 2+ on the right side and 2+ on the left side.      Patellar reflexes are 2+ on the right side and 2+ on the left side. Psychiatric:        Attention and Perception: Attention normal.        Mood and Affect: Mood normal.        Speech: Speech normal.        Behavior: Behavior normal. Behavior is cooperative.        Thought Content: Thought content normal.    Results for orders placed or performed in visit on 09/20/24  Pregnancy, urine   Collection Time: 09/20/24 11:30 AM  Result Value Ref Range   Preg Test, Ur Negative Negative  CBC with Differential/Platelet   Collection Time: 09/20/24 11:38 AM  Result Value Ref Range   WBC 9.1 3.4 - 10.8 x10E3/uL   RBC 4.26 3.77 - 5.28 x10E6/uL   Hemoglobin 13.6 11.1 - 15.9 g/dL   Hematocrit 58.5 65.9 - 46.6 %   MCV 97 79 - 97 fL   MCH 31.9 26.6 - 33.0 pg   MCHC 32.9 31.5 - 35.7 g/dL   RDW 88.1 88.2 - 84.5 %   Platelets 257 150 - 450 x10E3/uL   Neutrophils 72 Not Estab. %   Lymphs 18 Not Estab. %   Monocytes 8 Not Estab. %   Eos 1 Not Estab. %   Basos 1 Not Estab. %   Neutrophils Absolute 6.6 1.4 - 7.0 x10E3/uL   Lymphocytes Absolute 1.7 0.7 - 3.1 x10E3/uL   Monocytes Absolute 0.7 0.1 - 0.9 x10E3/uL   EOS (ABSOLUTE) 0.1 0.0 - 0.4 x10E3/uL   Basophils Absolute 0.1 0.0 - 0.2 x10E3/uL   Immature Granulocytes 0 Not Estab. %   Immature Grans (Abs) 0.0 0.0 - 0.1 x10E3/uL  Comprehensive metabolic panel with GFR   Collection Time: 09/20/24 11:38 AM  Result Value Ref Range   Glucose 71 70 - 99 mg/dL   BUN 10 6 - 20 mg/dL    Creatinine, Ser 9.30 0.57 - 1.00 mg/dL   eGFR 875 >40 fO/fpw/8.26   BUN/Creatinine Ratio 14 9 - 23   Sodium 141 134 - 144 mmol/L   Potassium 4.2 3.5 - 5.2 mmol/L   Chloride 105 96 - 106 mmol/L   CO2 21 20 - 29 mmol/L   Calcium 9.7 8.7 - 10.2 mg/dL   Total Protein 7.2 6.0 - 8.5 g/dL   Albumin 4.6 4.0 - 5.0 g/dL   Globulin, Total 2.6 1.5 - 4.5 g/dL   Bilirubin Total 0.3 0.0 - 1.2 mg/dL   Alkaline Phosphatase 42 41 - 116 IU/L   AST 13 0 - 40 IU/L   ALT 8 0 - 32 IU/L  TSH   Collection Time: 09/20/24 11:38 AM  Result Value Ref Range   TSH 1.990 0.450 - 4.500 uIU/mL  Lipid Panel w/o Chol/HDL Ratio   Collection Time: 09/20/24 11:38 AM  Result Value Ref Range   Cholesterol, Total 127 100 - 199 mg/dL   Triglycerides 60 0 - 149 mg/dL   HDL 51 >60 mg/dL   VLDL Cholesterol Cal 13 5 - 40 mg/dL   LDL Chol Calc (NIH) 63 0 - 99 mg/dL  Valproic Acid  level   Collection Time: 09/20/24 11:38 AM  Result Value Ref Range   Valproic Acid  Lvl 53 50 - 100 ug/mL      Assessment & Plan:   Problem List Items Addressed This Visit       Respiratory   Seasonal allergic rhinitis due to pollen   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 At this time recommend taking Zyrtec  daily and using Flonase to help with symptoms management. May take Mucinex  as needed for cough. If continues to worsen or ongoing past 7 days then return to office.        Other   Psychogenic nonepileptic seizure   Ongoing and follows with neurology.  Check labs today: Depakote  level.  She is being followed by [facility] neurology.  Continue neurology collaboration and Depakote  as ordered. Recent notes reviewed. Recommend she not drive at this time. Highly recommend she scheduled follow-up with neurology so they can further determine medication needs, she is allergic the Lamictal and Depakote  does not appear to be offering benefit.      Relevant Orders   Valproic Acid  level   Bipolar 2  disorder (HCC) - Primary   Chronic, ongoing.  Followed by psychiatry.  Denies SI/HI. Continue medication regimen as prescribed by them.  Discussed with her at length and educated.  Has safety plan in place if suicidal ideation were to present, is aware to immediately go to ER if intrusive thoughts. She reports psychiatry just performed genetic testing to determine medication needs.        Follow up plan: Return if symptoms worsen or fail to improve.

## 2024-10-02 NOTE — Assessment & Plan Note (Signed)
 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 At this time recommend taking Zyrtec  daily and using Flonase to help with symptoms management. May take Mucinex  as needed for cough. If continues to worsen or ongoing past 7 days then return to office.

## 2024-10-02 NOTE — Assessment & Plan Note (Signed)
 Chronic, ongoing.  Followed by psychiatry.  Denies SI/HI. Continue medication regimen as prescribed by them.  Discussed with her at length and educated.  Has safety plan in place if suicidal ideation were to present, is aware to immediately go to ER if intrusive thoughts. She reports psychiatry just performed genetic testing to determine medication needs.

## 2024-10-03 ENCOUNTER — Ambulatory Visit: Payer: Self-pay | Admitting: Nurse Practitioner

## 2024-10-03 LAB — VALPROIC ACID LEVEL: Valproic Acid Lvl: 16 ug/mL — ABNORMAL LOW (ref 50–100)

## 2024-10-03 NOTE — Progress Notes (Signed)
 Contacted via MyChart  Valproic level has trended down but it sounds like goal is to stop this in future. Please ensure to follow-up with neurology ASAP to discuss alternate options for your seizures. Any questions?

## 2024-10-28 ENCOUNTER — Ambulatory Visit

## 2024-11-04 DIAGNOSIS — J101 Influenza due to other identified influenza virus with other respiratory manifestations: Secondary | ICD-10-CM | POA: Diagnosis not present

## 2024-11-04 DIAGNOSIS — R509 Fever, unspecified: Secondary | ICD-10-CM | POA: Diagnosis not present

## 2024-11-18 ENCOUNTER — Ambulatory Visit

## 2024-11-18 ENCOUNTER — Ambulatory Visit: Payer: Self-pay

## 2024-11-18 NOTE — Telephone Encounter (Signed)
 FYI Only or Action Required?: FYI only for provider: appointment scheduled on 11/18/24 for depo injection, home care advised for symptoms.  Patient was last seen in primary care on 10/02/2024 by Valerio Melanie DASEN, NP.  Called Nurse Triage reporting Menstrual Problem.  Symptoms began a week ago.  Interventions attempted: Nothing.  Symptoms are: stable.  Triage Disposition: Home Care  Patient/caregiver understands and will follow disposition?: Yes  Reason for Disposition  [1] Bleeding or spotting between regular periods AND [2] occurs three cycles (3 months) or less this past year  Answer Assessment - Initial Assessment Questions Patient is experiencing mild vaginal spotting that started last week. She states that this has happened to her previously when she was overdue for depo injection and she is currently overdue. She denies dizziness/lightheadedness and abdominal pain. She is scheduled to receive injection this afternoon. Home care advised, instructed to call back if symptoms worsen.   1. BLEEDING SEVERITY: Describe the bleeding that you are having. How much bleeding is there?      Mild spotting  2. ONSET: When did the bleeding begin? Is it continuing now?     Last week  3. MENSTRUAL PERIOD: When was the last normal menstrual period? How is this different than your period?     Patient states that this just spotting and not like period flow  4. REGULARITY: How regular are your periods?     Unknown  5. ABDOMEN PAIN: Do you have any pain? How bad is the pain?  (e.g., Scale 0-10; none, mild, moderate, or severe)     No abdominal pain  6. PREGNANCY: Is there any chance you are pregnant? When was your last menstrual period?     No  7. BREASTFEEDING: Are you breastfeeding?     Unknown  8. HORMONE MEDICINES: Are you taking any hormone medicines, prescription or over-the-counter? (e.g., birth control pills, estrogen)     Receives Depo shot, states that  she was due for this mid December  9. BLOOD THINNER MEDICINES: Do you take any blood thinners? (e.g., Coumadin / warfarin, Pradaxa / dabigatran, aspirin)     No  10. CAUSE: What do you think is causing the bleeding? (e.g., recent gyn surgery, recent gyn procedure; known bleeding disorder, cervical cancer, polycystic ovarian disease, fibroids)         Due for birth control shot  11. HEMODYNAMIC STATUS: Are you weak or feeling lightheaded? If Yes, ask: Can you stand and walk normally?        Denies dizziness/lightheadedness  12. OTHER SYMPTOMS: What other symptoms are you having with the bleeding? (e.g., passed tissue, vaginal discharge, fever, menstrual-type cramps)       Denies any other symptoms  Protocols used: Vaginal Bleeding - Abnormal-A-AH  Copied from CRM #[id number]. Topic: Clinical - Red Word Triage >> Nov 18, 2024 12:43 PM Rosaria BRAVO wrote: Red Word that prompted transfer to Nurse Triage: Irregular spotting, due for depo shot. Scheduled today.

## 2024-12-09 ENCOUNTER — Ambulatory Visit

## 2025-03-24 ENCOUNTER — Ambulatory Visit: Admitting: Nurse Practitioner
